# Patient Record
Sex: Female | Born: 1935 | Race: White | Hispanic: No
Health system: Southern US, Community
[De-identification: ages and names within clinical notes are randomized; demographics above are authoritative.]

---

## 1997-08-18 ENCOUNTER — Inpatient Hospital Stay (HOSPITAL_COMMUNITY): Admission: RE | Admit: 1997-08-18 | Discharge: 1997-08-24 | Payer: Self-pay | Admitting: Orthopaedic Surgery

## 1997-08-26 ENCOUNTER — Other Ambulatory Visit: Admission: RE | Admit: 1997-08-26 | Discharge: 1997-08-26 | Payer: Self-pay | Admitting: Orthopaedic Surgery

## 1997-08-30 ENCOUNTER — Other Ambulatory Visit: Admission: RE | Admit: 1997-08-30 | Discharge: 1997-08-30 | Payer: Self-pay | Admitting: Orthopaedic Surgery

## 1997-09-06 ENCOUNTER — Other Ambulatory Visit: Admission: RE | Admit: 1997-09-06 | Discharge: 1997-09-06 | Payer: Self-pay | Admitting: Orthopaedic Surgery

## 1997-09-13 ENCOUNTER — Other Ambulatory Visit: Admission: RE | Admit: 1997-09-13 | Discharge: 1997-09-13 | Payer: Self-pay | Admitting: Orthopaedic Surgery

## 1997-09-20 ENCOUNTER — Other Ambulatory Visit: Admission: RE | Admit: 1997-09-20 | Discharge: 1997-09-20 | Payer: Self-pay | Admitting: Orthopaedic Surgery

## 1998-06-06 ENCOUNTER — Ambulatory Visit (HOSPITAL_COMMUNITY): Admission: RE | Admit: 1998-06-06 | Discharge: 1998-06-06 | Payer: Self-pay | Admitting: Gastroenterology

## 1998-12-13 ENCOUNTER — Ambulatory Visit (HOSPITAL_COMMUNITY): Admission: RE | Admit: 1998-12-13 | Discharge: 1998-12-13 | Payer: Self-pay | Admitting: Internal Medicine

## 1998-12-13 ENCOUNTER — Encounter: Payer: Self-pay | Admitting: Internal Medicine

## 1998-12-14 ENCOUNTER — Encounter: Payer: Self-pay | Admitting: Internal Medicine

## 1998-12-14 ENCOUNTER — Ambulatory Visit (HOSPITAL_COMMUNITY): Admission: RE | Admit: 1998-12-14 | Discharge: 1998-12-14 | Payer: Self-pay | Admitting: Internal Medicine

## 2000-04-18 ENCOUNTER — Other Ambulatory Visit: Admission: RE | Admit: 2000-04-18 | Discharge: 2000-04-18 | Payer: Self-pay | Admitting: Obstetrics and Gynecology

## 2000-07-08 ENCOUNTER — Encounter (INDEPENDENT_AMBULATORY_CARE_PROVIDER_SITE_OTHER): Payer: Self-pay

## 2000-07-08 ENCOUNTER — Inpatient Hospital Stay (HOSPITAL_COMMUNITY): Admission: RE | Admit: 2000-07-08 | Discharge: 2000-07-10 | Payer: Self-pay | Admitting: Obstetrics and Gynecology

## 2000-11-05 ENCOUNTER — Emergency Department (HOSPITAL_COMMUNITY): Admission: EM | Admit: 2000-11-05 | Discharge: 2000-11-05 | Payer: Self-pay | Admitting: Emergency Medicine

## 2001-09-11 ENCOUNTER — Encounter (INDEPENDENT_AMBULATORY_CARE_PROVIDER_SITE_OTHER): Payer: Self-pay

## 2001-09-11 ENCOUNTER — Ambulatory Visit (HOSPITAL_COMMUNITY): Admission: RE | Admit: 2001-09-11 | Discharge: 2001-09-11 | Payer: Self-pay | Admitting: Gastroenterology

## 2001-11-16 ENCOUNTER — Encounter: Admission: RE | Admit: 2001-11-16 | Discharge: 2001-11-16 | Payer: Self-pay | Admitting: Internal Medicine

## 2001-11-16 ENCOUNTER — Encounter: Payer: Self-pay | Admitting: Internal Medicine

## 2002-08-27 ENCOUNTER — Encounter: Payer: Self-pay | Admitting: Internal Medicine

## 2002-08-27 ENCOUNTER — Encounter: Admission: RE | Admit: 2002-08-27 | Discharge: 2002-08-27 | Payer: Self-pay | Admitting: Internal Medicine

## 2004-01-23 ENCOUNTER — Emergency Department (HOSPITAL_COMMUNITY): Admission: EM | Admit: 2004-01-23 | Discharge: 2004-01-24 | Payer: Self-pay | Admitting: Emergency Medicine

## 2004-02-14 ENCOUNTER — Encounter: Admission: RE | Admit: 2004-02-14 | Discharge: 2004-02-14 | Payer: Self-pay | Admitting: Internal Medicine

## 2004-02-22 ENCOUNTER — Ambulatory Visit: Payer: Self-pay | Admitting: Internal Medicine

## 2004-03-28 ENCOUNTER — Ambulatory Visit: Payer: Self-pay | Admitting: Internal Medicine

## 2004-04-07 ENCOUNTER — Ambulatory Visit: Payer: Self-pay | Admitting: Family Medicine

## 2004-04-11 ENCOUNTER — Ambulatory Visit: Payer: Self-pay | Admitting: Internal Medicine

## 2004-04-11 ENCOUNTER — Inpatient Hospital Stay (HOSPITAL_COMMUNITY): Admission: EM | Admit: 2004-04-11 | Discharge: 2004-04-14 | Payer: Self-pay | Admitting: Emergency Medicine

## 2004-05-02 ENCOUNTER — Ambulatory Visit: Payer: Self-pay | Admitting: Internal Medicine

## 2004-07-06 ENCOUNTER — Ambulatory Visit: Payer: Self-pay | Admitting: Internal Medicine

## 2004-07-19 ENCOUNTER — Ambulatory Visit: Payer: Self-pay | Admitting: Internal Medicine

## 2004-09-20 ENCOUNTER — Emergency Department (HOSPITAL_COMMUNITY): Admission: EM | Admit: 2004-09-20 | Discharge: 2004-09-20 | Payer: Self-pay | Admitting: Emergency Medicine

## 2004-11-01 ENCOUNTER — Ambulatory Visit: Payer: Self-pay | Admitting: Internal Medicine

## 2004-11-02 ENCOUNTER — Ambulatory Visit: Payer: Self-pay | Admitting: Internal Medicine

## 2005-02-20 ENCOUNTER — Ambulatory Visit: Payer: Self-pay | Admitting: Internal Medicine

## 2005-03-21 ENCOUNTER — Ambulatory Visit: Payer: Self-pay | Admitting: Internal Medicine

## 2005-03-28 ENCOUNTER — Ambulatory Visit: Payer: Self-pay | Admitting: Internal Medicine

## 2005-04-24 ENCOUNTER — Ambulatory Visit: Payer: Self-pay | Admitting: Internal Medicine

## 2005-05-27 ENCOUNTER — Ambulatory Visit: Payer: Self-pay | Admitting: Pulmonary Disease

## 2005-06-04 ENCOUNTER — Ambulatory Visit: Payer: Self-pay | Admitting: Internal Medicine

## 2005-06-11 ENCOUNTER — Ambulatory Visit: Payer: Self-pay | Admitting: Internal Medicine

## 2005-08-07 ENCOUNTER — Ambulatory Visit: Payer: Self-pay | Admitting: Internal Medicine

## 2005-10-22 ENCOUNTER — Ambulatory Visit: Payer: Self-pay | Admitting: Internal Medicine

## 2005-12-26 ENCOUNTER — Ambulatory Visit: Payer: Self-pay | Admitting: Internal Medicine

## 2006-01-10 ENCOUNTER — Ambulatory Visit: Payer: Self-pay | Admitting: Internal Medicine

## 2006-01-20 ENCOUNTER — Ambulatory Visit: Payer: Self-pay | Admitting: Internal Medicine

## 2006-02-06 ENCOUNTER — Ambulatory Visit: Payer: Self-pay | Admitting: Internal Medicine

## 2006-03-19 ENCOUNTER — Ambulatory Visit: Payer: Self-pay | Admitting: Internal Medicine

## 2006-04-01 ENCOUNTER — Ambulatory Visit: Payer: Self-pay | Admitting: Internal Medicine

## 2006-04-17 ENCOUNTER — Ambulatory Visit: Payer: Self-pay | Admitting: Internal Medicine

## 2006-05-14 ENCOUNTER — Ambulatory Visit: Payer: Self-pay | Admitting: Internal Medicine

## 2006-07-23 ENCOUNTER — Emergency Department (HOSPITAL_COMMUNITY): Admission: EM | Admit: 2006-07-23 | Discharge: 2006-07-24 | Payer: Self-pay | Admitting: Emergency Medicine

## 2006-08-19 ENCOUNTER — Ambulatory Visit: Payer: Self-pay | Admitting: Internal Medicine

## 2006-10-09 ENCOUNTER — Ambulatory Visit: Payer: Self-pay | Admitting: Internal Medicine

## 2006-11-06 DIAGNOSIS — M159 Polyosteoarthritis, unspecified: Secondary | ICD-10-CM | POA: Insufficient documentation

## 2006-11-06 DIAGNOSIS — M810 Age-related osteoporosis without current pathological fracture: Secondary | ICD-10-CM | POA: Insufficient documentation

## 2006-11-06 DIAGNOSIS — Z8669 Personal history of other diseases of the nervous system and sense organs: Secondary | ICD-10-CM

## 2006-11-06 DIAGNOSIS — F329 Major depressive disorder, single episode, unspecified: Secondary | ICD-10-CM

## 2006-11-06 DIAGNOSIS — F0390 Unspecified dementia without behavioral disturbance: Secondary | ICD-10-CM

## 2006-11-06 DIAGNOSIS — H918X9 Other specified hearing loss, unspecified ear: Secondary | ICD-10-CM | POA: Insufficient documentation

## 2006-11-06 DIAGNOSIS — J45909 Unspecified asthma, uncomplicated: Secondary | ICD-10-CM | POA: Insufficient documentation

## 2006-11-06 DIAGNOSIS — Z87898 Personal history of other specified conditions: Secondary | ICD-10-CM | POA: Insufficient documentation

## 2006-11-13 ENCOUNTER — Ambulatory Visit: Payer: Self-pay | Admitting: Internal Medicine

## 2006-11-13 LAB — CONVERTED CEMR LAB
Bilirubin Urine: NEGATIVE
Crystals: NEGATIVE
Hemoglobin, Urine: NEGATIVE
Ketones, ur: NEGATIVE mg/dL
Mucus, UA: NEGATIVE
Nitrite: NEGATIVE
Specific Gravity, Urine: 1.02 (ref 1.000–1.03)
Urine Glucose: NEGATIVE mg/dL
Urobilinogen, UA: 0.2 (ref 0.0–1.0)
pH: 5.5 (ref 5.0–8.0)

## 2006-11-18 ENCOUNTER — Ambulatory Visit: Payer: Self-pay | Admitting: Internal Medicine

## 2006-11-18 LAB — CONVERTED CEMR LAB
ALT: 32 U/L
AST: 19 U/L
Albumin: 3.2 g/dL — ABNORMAL LOW
Alkaline Phosphatase: 94 U/L
BUN: 7 mg/dL
Basophils Absolute: 0 10*3/uL
Basophils Relative: 0.6 %
Bilirubin, Direct: 0.1 mg/dL
CO2: 30 meq/L
Calcium: 9.7 mg/dL
Chloride: 104 meq/L
Cholesterol: 171 mg/dL
Creatinine, Ser: 0.7 mg/dL
Eosinophils Absolute: 0.4 10*3/uL
Eosinophils Relative: 6.3 % — ABNORMAL HIGH
GFR calc Af Amer: 103 mL/min
GFR calc non Af Amer: 85 mL/min
Glucose, Bld: 106 mg/dL — ABNORMAL HIGH
HCT: 40.6 %
HDL: 25.1 mg/dL — ABNORMAL LOW
Hemoglobin: 13.6 g/dL
Hgb A1c MFr Bld: 6.4 % — ABNORMAL HIGH
LDL Cholesterol: 121 mg/dL — ABNORMAL HIGH
Lymphocytes Relative: 21.8 %
MCHC: 33.5 g/dL
MCV: 85.4 fL
Monocytes Absolute: 0.5 10*3/uL
Monocytes Relative: 9 %
Neutro Abs: 3.7 10*3/uL
Neutrophils Relative %: 62.3 %
Platelets: 262 10*3/uL
Potassium: 4.6 meq/L
RBC: 4.76 M/uL
RDW: 13 %
Sodium: 139 meq/L
TSH: 1.91 u[IU]/mL
Total Bilirubin: 0.6 mg/dL
Total CHOL/HDL Ratio: 6.8
Total Protein: 6.8 g/dL
Triglycerides: 122 mg/dL
VLDL: 24 mg/dL
WBC: 5.9 10*3/uL

## 2006-11-19 DIAGNOSIS — I10 Essential (primary) hypertension: Secondary | ICD-10-CM | POA: Insufficient documentation

## 2006-11-19 DIAGNOSIS — J309 Allergic rhinitis, unspecified: Secondary | ICD-10-CM | POA: Insufficient documentation

## 2006-12-05 ENCOUNTER — Ambulatory Visit: Payer: Self-pay | Admitting: Internal Medicine

## 2007-02-26 ENCOUNTER — Encounter: Payer: Self-pay | Admitting: Internal Medicine

## 2007-03-05 ENCOUNTER — Encounter: Payer: Self-pay | Admitting: Internal Medicine

## 2007-03-09 ENCOUNTER — Telehealth: Payer: Self-pay | Admitting: Internal Medicine

## 2007-03-09 DIAGNOSIS — E739 Lactose intolerance, unspecified: Secondary | ICD-10-CM

## 2007-03-09 DIAGNOSIS — E785 Hyperlipidemia, unspecified: Secondary | ICD-10-CM | POA: Insufficient documentation

## 2007-03-09 DIAGNOSIS — D649 Anemia, unspecified: Secondary | ICD-10-CM

## 2007-03-09 DIAGNOSIS — G43009 Migraine without aura, not intractable, without status migrainosus: Secondary | ICD-10-CM | POA: Insufficient documentation

## 2007-03-26 ENCOUNTER — Encounter: Payer: Self-pay | Admitting: Internal Medicine

## 2007-04-28 ENCOUNTER — Encounter: Payer: Self-pay | Admitting: Internal Medicine

## 2007-05-06 ENCOUNTER — Ambulatory Visit: Payer: Self-pay | Admitting: Internal Medicine

## 2007-05-06 ENCOUNTER — Inpatient Hospital Stay (HOSPITAL_COMMUNITY): Admission: EM | Admit: 2007-05-06 | Discharge: 2007-05-10 | Payer: Self-pay | Admitting: Emergency Medicine

## 2007-05-06 ENCOUNTER — Encounter: Payer: Self-pay | Admitting: Internal Medicine

## 2007-05-12 ENCOUNTER — Telehealth: Payer: Self-pay | Admitting: Internal Medicine

## 2007-05-13 ENCOUNTER — Encounter: Payer: Self-pay | Admitting: Internal Medicine

## 2007-05-15 ENCOUNTER — Encounter: Payer: Self-pay | Admitting: Internal Medicine

## 2007-05-19 ENCOUNTER — Encounter: Payer: Self-pay | Admitting: Internal Medicine

## 2007-06-04 ENCOUNTER — Ambulatory Visit: Payer: Self-pay | Admitting: Internal Medicine

## 2007-06-12 ENCOUNTER — Ambulatory Visit: Payer: Self-pay | Admitting: Internal Medicine

## 2007-07-27 ENCOUNTER — Encounter: Payer: Self-pay | Admitting: Internal Medicine

## 2007-08-31 ENCOUNTER — Encounter: Payer: Self-pay | Admitting: Internal Medicine

## 2007-09-01 ENCOUNTER — Encounter: Payer: Self-pay | Admitting: Internal Medicine

## 2007-09-03 ENCOUNTER — Encounter: Payer: Self-pay | Admitting: Internal Medicine

## 2007-10-01 ENCOUNTER — Ambulatory Visit: Payer: Self-pay | Admitting: Internal Medicine

## 2007-10-07 ENCOUNTER — Ambulatory Visit: Payer: Self-pay | Admitting: Internal Medicine

## 2007-10-19 ENCOUNTER — Encounter: Payer: Self-pay | Admitting: Internal Medicine

## 2007-10-26 ENCOUNTER — Telehealth: Payer: Self-pay | Admitting: Endocrinology

## 2007-10-26 ENCOUNTER — Ambulatory Visit: Payer: Self-pay | Admitting: Internal Medicine

## 2007-11-01 ENCOUNTER — Encounter: Payer: Self-pay | Admitting: Internal Medicine

## 2007-11-06 ENCOUNTER — Encounter: Payer: Self-pay | Admitting: Internal Medicine

## 2007-11-16 ENCOUNTER — Encounter: Payer: Self-pay | Admitting: Internal Medicine

## 2007-12-08 ENCOUNTER — Encounter: Payer: Self-pay | Admitting: Internal Medicine

## 2007-12-14 ENCOUNTER — Encounter: Payer: Self-pay | Admitting: Internal Medicine

## 2007-12-15 ENCOUNTER — Encounter: Payer: Self-pay | Admitting: Internal Medicine

## 2007-12-16 ENCOUNTER — Ambulatory Visit: Payer: Self-pay | Admitting: Internal Medicine

## 2007-12-24 ENCOUNTER — Telehealth: Payer: Self-pay | Admitting: Internal Medicine

## 2007-12-29 ENCOUNTER — Encounter: Payer: Self-pay | Admitting: Internal Medicine

## 2007-12-31 ENCOUNTER — Encounter: Payer: Self-pay | Admitting: Internal Medicine

## 2008-01-12 ENCOUNTER — Telehealth: Payer: Self-pay | Admitting: Internal Medicine

## 2008-01-13 ENCOUNTER — Encounter: Payer: Self-pay | Admitting: Internal Medicine

## 2008-01-18 ENCOUNTER — Telehealth: Payer: Self-pay | Admitting: Internal Medicine

## 2008-01-18 ENCOUNTER — Encounter: Payer: Self-pay | Admitting: Internal Medicine

## 2008-02-08 ENCOUNTER — Ambulatory Visit: Payer: Self-pay | Admitting: Internal Medicine

## 2008-02-09 ENCOUNTER — Encounter: Payer: Self-pay | Admitting: Internal Medicine

## 2008-02-25 ENCOUNTER — Ambulatory Visit: Payer: Self-pay | Admitting: Internal Medicine

## 2008-02-25 DIAGNOSIS — N390 Urinary tract infection, site not specified: Secondary | ICD-10-CM

## 2008-02-25 DIAGNOSIS — R32 Unspecified urinary incontinence: Secondary | ICD-10-CM

## 2008-02-25 DIAGNOSIS — E119 Type 2 diabetes mellitus without complications: Secondary | ICD-10-CM

## 2008-02-25 DIAGNOSIS — R5381 Other malaise: Secondary | ICD-10-CM

## 2008-02-25 DIAGNOSIS — R5383 Other fatigue: Secondary | ICD-10-CM

## 2008-02-25 LAB — CONVERTED CEMR LAB
ALT: 16 units/L (ref 0–35)
AST: 23 units/L (ref 0–37)
Albumin: 3.8 g/dL (ref 3.5–5.2)
Alkaline Phosphatase: 62 units/L (ref 39–117)
BUN: 12 mg/dL (ref 6–23)
Basophils Absolute: 0.1 10*3/uL (ref 0.0–0.1)
Basophils Relative: 1.9 % (ref 0.0–3.0)
Bilirubin Urine: NEGATIVE
Bilirubin, Direct: 0.1 mg/dL (ref 0.0–0.3)
CO2: 32 meq/L (ref 19–32)
Calcium: 10.1 mg/dL (ref 8.4–10.5)
Chloride: 101 meq/L (ref 96–112)
Cholesterol: 178 mg/dL (ref 0–200)
Creatinine, Ser: 0.7 mg/dL (ref 0.4–1.2)
Crystals: NEGATIVE
Eosinophils Absolute: 0.2 10*3/uL (ref 0.0–0.7)
Eosinophils Relative: 3.9 % (ref 0.0–5.0)
Folate: >20 ng/mL
GFR calc Af Amer: 103 mL/min
GFR calc non Af Amer: 85 mL/min
Glucose, Bld: 91 mg/dL (ref 70–99)
HCT: 43.9 % (ref 36.0–46.0)
HDL: 41 mg/dL (ref >39.0)
Hemoglobin, Urine: NEGATIVE
Hemoglobin: 15.4 g/dL — ABNORMAL HIGH (ref 12.0–15.0)
Hgb A1c MFr Bld: 6.5 % — ABNORMAL HIGH (ref 4.6–6.0)
Ketones, ur: NEGATIVE mg/dL
LDL Cholesterol: 117 mg/dL — ABNORMAL HIGH (ref 0–99)
Lymphocytes Relative: 22.2 % (ref 12.0–46.0)
MCHC: 35.1 g/dL (ref 30.0–36.0)
MCV: 83.7 fL (ref 78.0–100.0)
Monocytes Absolute: 0.6 10*3/uL (ref 0.1–1.0)
Monocytes Relative: 10.4 % (ref 3.0–12.0)
Mucus, UA: NEGATIVE
Neutro Abs: 3.5 10*3/uL (ref 1.4–7.7)
Neutrophils Relative %: 61.6 % (ref 43.0–77.0)
Nitrite: POSITIVE — AB
Platelets: 157 10*3/uL (ref 150–400)
Potassium: 4.7 meq/L (ref 3.5–5.1)
RBC: 5.24 M/uL — ABNORMAL HIGH (ref 3.87–5.11)
RDW: 13.3 % (ref 11.5–14.6)
Sodium: 139 meq/L (ref 135–145)
Specific Gravity, Urine: 1.01 (ref 1.000–1.03)
TSH: 2.58 microintl units/mL (ref 0.35–5.50)
Total Bilirubin: 0.6 mg/dL (ref 0.3–1.2)
Total CHOL/HDL Ratio: 4.3
Total Protein, Urine: NEGATIVE mg/dL
Total Protein: 7.7 g/dL (ref 6.0–8.3)
Triglycerides: 102 mg/dL (ref 0–149)
Urine Glucose: NEGATIVE mg/dL
Urobilinogen, UA: 0.2 (ref 0.0–1.0)
VLDL: 20 mg/dL (ref 0–40)
Vit D, 1,25-Dihydroxy: 40 (ref 30–89)
Vitamin B-12: 553 pg/mL (ref 211–911)
WBC: 5.7 10*3/uL (ref 4.5–10.5)
pH: 6.5 (ref 5.0–8.0)

## 2008-02-28 ENCOUNTER — Encounter: Payer: Self-pay | Admitting: Internal Medicine

## 2008-03-24 ENCOUNTER — Telehealth: Payer: Self-pay | Admitting: Internal Medicine

## 2008-03-29 ENCOUNTER — Encounter: Payer: Self-pay | Admitting: Internal Medicine

## 2008-04-12 ENCOUNTER — Encounter: Payer: Self-pay | Admitting: Internal Medicine

## 2008-04-23 ENCOUNTER — Emergency Department (HOSPITAL_BASED_OUTPATIENT_CLINIC_OR_DEPARTMENT_OTHER): Admission: EM | Admit: 2008-04-23 | Discharge: 2008-04-23 | Payer: Self-pay | Admitting: Emergency Medicine

## 2008-04-23 ENCOUNTER — Ambulatory Visit: Payer: Self-pay | Admitting: Interventional Radiology

## 2008-04-24 ENCOUNTER — Encounter: Payer: Self-pay | Admitting: Internal Medicine

## 2008-04-26 ENCOUNTER — Telehealth: Payer: Self-pay | Admitting: Internal Medicine

## 2008-05-19 ENCOUNTER — Encounter: Payer: Self-pay | Admitting: Internal Medicine

## 2008-06-02 ENCOUNTER — Encounter: Payer: Self-pay | Admitting: Internal Medicine

## 2008-06-27 ENCOUNTER — Encounter: Payer: Self-pay | Admitting: Internal Medicine

## 2008-08-08 ENCOUNTER — Encounter: Payer: Self-pay | Admitting: Internal Medicine

## 2008-08-29 ENCOUNTER — Telehealth: Payer: Self-pay | Admitting: Internal Medicine

## 2008-09-09 ENCOUNTER — Telehealth: Payer: Self-pay | Admitting: Internal Medicine

## 2008-09-20 ENCOUNTER — Ambulatory Visit: Payer: Self-pay | Admitting: Internal Medicine

## 2008-09-21 LAB — CONVERTED CEMR LAB
ALT: 12 units/L (ref 0–35)
AST: 23 units/L (ref 0–37)
Albumin: 3.9 g/dL (ref 3.5–5.2)
Alkaline Phosphatase: 56 units/L (ref 39–117)
BUN: 12 mg/dL (ref 6–23)
Basophils Absolute: 0 10*3/uL (ref 0.0–0.1)
Basophils Relative: 0.8 % (ref 0.0–3.0)
Bilirubin, Direct: 0.2 mg/dL (ref 0.0–0.3)
CO2: 29 meq/L (ref 19–32)
Calcium: 9.7 mg/dL (ref 8.4–10.5)
Chloride: 108 meq/L (ref 96–112)
Cholesterol: 210 mg/dL — ABNORMAL HIGH (ref 0–200)
Creatinine, Ser: 0.9 mg/dL (ref 0.4–1.2)
Direct LDL: 154.7 mg/dL
Eosinophils Absolute: 0.4 10*3/uL (ref 0.0–0.7)
Eosinophils Relative: 8 % — ABNORMAL HIGH (ref 0.0–5.0)
Folate: >20 ng/mL
GFR calc non Af Amer: 63.36 mL/min (ref >60)
Glucose, Bld: 92 mg/dL (ref 70–99)
HCT: 43.1 % (ref 36.0–46.0)
HDL: 39.5 mg/dL (ref >39.00)
Hemoglobin: 14.7 g/dL (ref 12.0–15.0)
Hgb A1c MFr Bld: 6.1 % (ref 4.6–6.5)
Lymphocytes Relative: 18.2 % (ref 12.0–46.0)
Lymphs Abs: 1 10*3/uL (ref 0.7–4.0)
MCHC: 34 g/dL (ref 30.0–36.0)
MCV: 86.4 fL (ref 78.0–100.0)
Monocytes Absolute: 0.5 10*3/uL (ref 0.1–1.0)
Monocytes Relative: 9 % (ref 3.0–12.0)
Neutro Abs: 3.6 10*3/uL (ref 1.4–7.7)
Neutrophils Relative %: 64 % (ref 43.0–77.0)
Platelets: 140 10*3/uL — ABNORMAL LOW (ref 150.0–400.0)
Potassium: 4.4 meq/L (ref 3.5–5.1)
RBC: 4.99 M/uL (ref 3.87–5.11)
RDW: 13.5 % (ref 11.5–14.6)
Sed Rate: 26 mm/hr — ABNORMAL HIGH (ref 0–22)
Sodium: 139 meq/L (ref 135–145)
TSH: 2.71 microintl units/mL (ref 0.35–5.50)
Total Bilirubin: 0.9 mg/dL (ref 0.3–1.2)
Total CHOL/HDL Ratio: 5
Total Protein: 8.5 g/dL — ABNORMAL HIGH (ref 6.0–8.3)
Triglycerides: 144 mg/dL (ref 0.0–149.0)
VLDL: 28.8 mg/dL (ref 0.0–40.0)
Vitamin B-12: 714 pg/mL (ref 211–911)
WBC: 5.5 10*3/uL (ref 4.5–10.5)

## 2008-09-22 ENCOUNTER — Encounter: Payer: Self-pay | Admitting: Internal Medicine

## 2008-09-22 ENCOUNTER — Telehealth: Payer: Self-pay | Admitting: Internal Medicine

## 2008-10-20 ENCOUNTER — Encounter: Payer: Self-pay | Admitting: Internal Medicine

## 2008-10-25 ENCOUNTER — Telehealth: Payer: Self-pay | Admitting: Internal Medicine

## 2008-10-27 ENCOUNTER — Telehealth: Payer: Self-pay | Admitting: Internal Medicine

## 2008-10-31 ENCOUNTER — Telehealth: Payer: Self-pay | Admitting: Internal Medicine

## 2008-12-27 ENCOUNTER — Telehealth: Payer: Self-pay | Admitting: Internal Medicine

## 2009-01-12 ENCOUNTER — Encounter: Payer: Self-pay | Admitting: Internal Medicine

## 2009-01-23 ENCOUNTER — Encounter: Payer: Self-pay | Admitting: Internal Medicine

## 2009-01-27 ENCOUNTER — Encounter: Payer: Self-pay | Admitting: Internal Medicine

## 2009-01-27 ENCOUNTER — Telehealth: Payer: Self-pay | Admitting: Internal Medicine

## 2009-02-02 ENCOUNTER — Encounter: Payer: Self-pay | Admitting: Internal Medicine

## 2009-02-14 ENCOUNTER — Encounter: Payer: Self-pay | Admitting: Internal Medicine

## 2009-02-15 ENCOUNTER — Ambulatory Visit (HOSPITAL_COMMUNITY): Admission: RE | Admit: 2009-02-15 | Discharge: 2009-02-15 | Payer: Self-pay | Admitting: Internal Medicine

## 2009-02-15 ENCOUNTER — Encounter: Payer: Self-pay | Admitting: Internal Medicine

## 2009-02-20 ENCOUNTER — Encounter: Payer: Self-pay | Admitting: Internal Medicine

## 2009-02-21 ENCOUNTER — Encounter: Payer: Self-pay | Admitting: Internal Medicine

## 2009-02-22 ENCOUNTER — Encounter: Payer: Self-pay | Admitting: Internal Medicine

## 2009-02-24 ENCOUNTER — Encounter: Payer: Self-pay | Admitting: Internal Medicine

## 2009-02-27 ENCOUNTER — Telehealth: Payer: Self-pay | Admitting: Internal Medicine

## 2009-03-01 ENCOUNTER — Telehealth: Payer: Self-pay | Admitting: Internal Medicine

## 2009-03-02 ENCOUNTER — Encounter: Payer: Self-pay | Admitting: Internal Medicine

## 2009-03-03 ENCOUNTER — Encounter: Payer: Self-pay | Admitting: Internal Medicine

## 2009-03-08 ENCOUNTER — Telehealth (INDEPENDENT_AMBULATORY_CARE_PROVIDER_SITE_OTHER): Payer: Self-pay | Admitting: *Deleted

## 2009-03-10 ENCOUNTER — Encounter: Payer: Self-pay | Admitting: Internal Medicine

## 2009-03-13 ENCOUNTER — Encounter: Payer: Self-pay | Admitting: Internal Medicine

## 2009-03-14 ENCOUNTER — Encounter: Payer: Self-pay | Admitting: Internal Medicine

## 2009-03-15 ENCOUNTER — Encounter: Payer: Self-pay | Admitting: Internal Medicine

## 2009-03-20 ENCOUNTER — Encounter: Payer: Self-pay | Admitting: Internal Medicine

## 2009-03-21 ENCOUNTER — Encounter: Payer: Self-pay | Admitting: Internal Medicine

## 2009-03-24 ENCOUNTER — Encounter: Payer: Self-pay | Admitting: Internal Medicine

## 2009-03-28 ENCOUNTER — Telehealth: Payer: Self-pay | Admitting: Internal Medicine

## 2009-03-30 ENCOUNTER — Encounter: Payer: Self-pay | Admitting: Internal Medicine

## 2009-04-12 ENCOUNTER — Encounter: Payer: Self-pay | Admitting: Internal Medicine

## 2009-04-18 ENCOUNTER — Encounter: Payer: Self-pay | Admitting: Internal Medicine

## 2009-04-24 ENCOUNTER — Telehealth: Payer: Self-pay | Admitting: Internal Medicine

## 2009-04-25 ENCOUNTER — Ambulatory Visit: Payer: Self-pay | Admitting: Internal Medicine

## 2009-04-25 DIAGNOSIS — M549 Dorsalgia, unspecified: Secondary | ICD-10-CM | POA: Insufficient documentation

## 2009-04-26 LAB — CONVERTED CEMR LAB
ALT: 129 units/L — ABNORMAL HIGH (ref 0–35)
AST: 189 units/L — ABNORMAL HIGH (ref 0–37)
Albumin: 2.5 g/dL — ABNORMAL LOW (ref 3.5–5.2)
Alkaline Phosphatase: 98 units/L (ref 39–117)
BUN: 7 mg/dL (ref 6–23)
Basophils Absolute: 0 10*3/uL (ref 0.0–0.1)
Basophils Relative: 0 % (ref 0.0–3.0)
Bilirubin, Direct: 0.3 mg/dL (ref 0.0–0.3)
CO2: 28 meq/L (ref 19–32)
Calcium: 9.4 mg/dL (ref 8.4–10.5)
Chloride: 98 meq/L (ref 96–112)
Creatinine, Ser: 0.9 mg/dL (ref 0.4–1.2)
Eosinophils Absolute: 0.2 10*3/uL (ref 0.0–0.7)
Eosinophils Relative: 3.5 % (ref 0.0–5.0)
Folate: 17.9 ng/mL
GFR calc non Af Amer: 63.26 mL/min (ref >60)
Glucose, Bld: 93 mg/dL (ref 70–99)
HCT: 47.3 % — ABNORMAL HIGH (ref 36.0–46.0)
Hemoglobin: 15.6 g/dL — ABNORMAL HIGH (ref 12.0–15.0)
Hgb A1c MFr Bld: 6.1 % (ref 4.6–6.5)
Iron: 73 ug/dL (ref 42–145)
Lymphocytes Relative: 14.6 % (ref 12.0–46.0)
Lymphs Abs: 0.9 10*3/uL (ref 0.7–4.0)
MCHC: 33 g/dL (ref 30.0–36.0)
MCV: 89 fL (ref 78.0–100.0)
Monocytes Absolute: 0.5 10*3/uL (ref 0.1–1.0)
Monocytes Relative: 7.3 % (ref 3.0–12.0)
Neutro Abs: 4.7 10*3/uL (ref 1.4–7.7)
Neutrophils Relative %: 74.6 % (ref 43.0–77.0)
Platelets: 147 10*3/uL — ABNORMAL LOW (ref 150.0–400.0)
Potassium: 4.2 meq/L (ref 3.5–5.1)
RBC: 5.32 M/uL — ABNORMAL HIGH (ref 3.87–5.11)
RDW: 16.7 % — ABNORMAL HIGH (ref 11.5–14.6)
Saturation Ratios: 32.6 % (ref 20.0–50.0)
Sed Rate: 25 mm/hr — ABNORMAL HIGH (ref 0–22)
Sodium: 132 meq/L — ABNORMAL LOW (ref 135–145)
TSH: 2.24 microintl units/mL (ref 0.35–5.50)
Total Bilirubin: 1 mg/dL (ref 0.3–1.2)
Total Protein: 9.6 g/dL — ABNORMAL HIGH (ref 6.0–8.3)
Transferrin: 159.8 mg/dL — ABNORMAL LOW (ref 212.0–360.0)
Vitamin B-12: >1500 pg/mL — ABNORMAL HIGH (ref 211–911)
WBC: 6.3 10*3/uL (ref 4.5–10.5)

## 2009-05-03 ENCOUNTER — Telehealth: Payer: Self-pay | Admitting: Internal Medicine

## 2009-05-23 ENCOUNTER — Encounter: Payer: Self-pay | Admitting: Internal Medicine

## 2009-05-23 ENCOUNTER — Emergency Department (HOSPITAL_COMMUNITY): Admission: EM | Admit: 2009-05-23 | Discharge: 2009-05-23 | Payer: Self-pay | Admitting: Emergency Medicine

## 2009-05-24 ENCOUNTER — Encounter: Payer: Self-pay | Admitting: Internal Medicine

## 2009-05-26 ENCOUNTER — Telehealth: Payer: Self-pay | Admitting: Internal Medicine

## 2009-05-29 ENCOUNTER — Ambulatory Visit: Payer: Self-pay | Admitting: Internal Medicine

## 2010-05-22 NOTE — Miscellaneous (Signed)
Summary: [facility]   Imported By: Sherian Rein 04/26/2009 07:16:09  _____________________________________________________________________  External Attachment:    Type:   Image     Comment:   External Document

## 2010-05-22 NOTE — Progress Notes (Signed)
  Phone Note Refill Request  on April 24, 2009 10:56 AM  Refills Requested: Medication #1:  ALENDRONATE SODIUM 70 MG  TABS 1 by mouth qwk   Dosage confirmed as above?Dosage Confirmed   Notes: [facility] Initial call taken by: Scharlene Gloss,  April 24, 2009 10:57 AM    Prescriptions: ALENDRONATE SODIUM 70 MG  TABS (ALENDRONATE SODIUM) 1 by mouth qwk  #4 x 6   Entered by:   Scharlene Gloss   Authorized by:   Corwin Levins MD   Signed by:   Scharlene Gloss on 04/24/2009   Method used:   Faxed to ...       [facility]* (retail)       [address] [place], [place]  [address]       Ph: [phone]       Fax: [phone]   RxID:   [id number] ALBUTEROL 90 MCG/ACT AERS (ALBUTEROL) Inhale 2 puff using inhaler every twelve hours  #1 x 7   Entered by:   Scharlene Gloss   Authorized by:   Corwin Levins MD   Signed by:   Scharlene Gloss on 04/24/2009   Method used:   Faxed to ...       [facility]* (retail)       [address] [place], [place]  [address]       Ph: [phone]       Fax: [phone]   RxID:   [id number]

## 2010-05-22 NOTE — Progress Notes (Signed)
  Phone Note Refill Request  on May 03, 2009 2:02 PM  Refills Requested: Medication #1:  SIMVASTATIN 40 MG TABS 1 by mouth once daily   Dosage confirmed as above?Dosage Confirmed   Notes: [facility] Initial call taken by: Scharlene Gloss,  May 03, 2009 2:03 PM    Prescriptions: SIMVASTATIN 40 MG TABS (SIMVASTATIN) 1 by mouth once daily  #90 x 3   Entered by:   Scharlene Gloss   Authorized by:   Corwin Levins MD   Signed by:   Scharlene Gloss on 05/03/2009   Method used:   Faxed to ...       [facility]* (retail)       [address] [place], [place]  [address]       Ph: [phone]       Fax: [phone]   RxID:   [id number]

## 2010-05-22 NOTE — Progress Notes (Signed)
  Phone Note Call from Patient Call back at [phone]   Caller: Patricia Herman - daughter in law -- [phone] Call For: Dr Jonny Ruiz Summary of Call: Pt daughter in law called to let Dr Jonny Ruiz know that Patricia Herman had taken a turn for the worse. Pt is not eating, taking medicine. Pt left [facility]1 to live with daughter in Social worker. Pt is deaf, cant read lips, but writes notes for her needs. Pt just lays in bed and moans and groans. Please advise Initial call taken by: Verdell Face,  May 26, 2009 9:52 AM  Follow-up for Phone Call        can we refer to hospice (or is she already being followed?);   consider going to ER if she has stopped eating or meds as she will likely be admitted if hospice has not already been arranged Follow-up by: Corwin Levins MD,  May 26, 2009 1:21 PM  Additional Follow-up for Phone Call Additional follow up Details #1::        pt's daughter in law will take pt to ER in [facility] Additional Follow-up by: Margaret Pyle, CMA,  May 26, 2009 1:53 PM

## 2010-05-22 NOTE — Letter (Signed)
Summary: [facility]   Imported By: Lester Gamaliel 05/29/2009 08:35:46  _____________________________________________________________________  External Attachment:    Type:   Image     Comment:   External Document

## 2010-05-22 NOTE — Miscellaneous (Signed)
Summary: [facility]   Imported By: Lester Sabine 05/25/2009 09:48:37  _____________________________________________________________________  External Attachment:    Type:   Image     Comment:   External Document

## 2010-05-22 NOTE — Assessment & Plan Note (Signed)
Summary: wt loss/cough/coming from [facility]/#/cd   Vital Signs:  Patient profile:   77 year old female Height:      63 inches Weight:      145 pounds O2 Sat:      82 % on Room air Temp:     97.2 degrees F oral Pulse rate:   92 / minute BP sitting:   142 / 92  (left arm) Cuff size:   regular  Vitals Entered ByZella Ball Ewing (April 25, 2009 11:03 AM)  O2 Flow:  Room air CC: SOB,congested,cough, weight loss/RE   CC:  SOB, congested, cough, and weight loss/RE.  History of Present Illness: pt with severe HOH, as well as dementia and poor historian;  also person with her from [facility] has left the exam room apparetnly to personally see to another pt's transportation needs so is unavailble to help wtih hx;  after written and otherwise rather loud questioning I am able to determine she has been weak, feeling poorly and ill with cough and some type of chest congestion in the past wk (or less it seems);  Pt denies CP, sob, doe, wheezing, orthopnea, pnd, worsening LE edema, palps, dizziness or syncope but again hx limited due to dementia.  Also c/o mid and rigth lower back pain, but I am unable to get from her the character, timing, aggrevating or alleviating factors or any assoc symptoms such as GU symtpoms.   Does not have tramadol on her med list and I dont get the sense she has significnat constipation at this time.  May have had some recent wt loss, but denies decreased by mouth intake, dysphagia, abd pain or change in bowel habits.  Pt denies polydipsia, polyuria, or low sugar symptoms such as shakiness improved with eating.  Overall good compliance with meds most likelky as she is assisted living.  No obvius bruising and pt denies recent falls  Problems Prior to Update: 1)  Back Pain  (ICD-724.5) 2)  Bronchitis-acute  (ICD-466.0) 3)  Fatigue  (ICD-780.79) 4)  Diabetes Mellitus, Type II  (ICD-250.00) 5)  Unspecified Urinary Incontinence  (ICD-788.30) 6)  Urinary Tract Infection,  Recurrent  (ICD-599.0) 7)  Common Migraine  (ICD-346.10) 8)  Family History Breast Cancer 1st Degree Relative <50  (ICD-V16.3) 9)  Anemia-nos  (ICD-285.9) 10)  Hyperlipidemia  (ICD-272.4) 11)  Glucose Intolerance  (ICD-271.3) 12)  Degenerative Joint Disease, Generalized  (ICD-715.00) 13)  Loss, Hearing Nec  (ICD-389.8) 14)  Dementia, Presenile W/depression  (ICD-290.13) 15)  Bell's Palsy, Hx of  (ICD-V12.49) 16)  Shingles, Hx of  (ICD-V13.8) 17)  Hypertension  (ICD-401.9) 18)  Allergic Rhinitis  (ICD-477.9) 19)  Osteoporosis  (ICD-733.00) 20)  Asthma  (ICD-493.90)  Medications Prior to Update: 1)  Advair Diskus 250-50 Mcg/dose Misc (Fluticasone-Salmeterol) .... Inhale 1 Puff As Directed Twice A Day 2)  Albuterol 90 Mcg/act Aers (Albuterol) .... Inhale 2 Puff Using Inhaler Every Twelve Hours 3)  Singulair 10 Mg Tabs (Montelukast Sodium) .... Take 1 Tablet By Mouth Once A Day 4)  Cetirizine Hcl 10 Mg Tabs (Cetirizine Hcl) .Marland Kitchen.. 1 By Mouth Once Daily 5)  Fluticasone Propionate 50 Mcg/act  Susp (Fluticasone Propionate) .... 2 Spray/side Once Daily 6)  Alendronate Sodium 70 Mg  Tabs (Alendronate Sodium) .Marland Kitchen.. 1 By Mouth Qwk 7)  Ecotrin Low Strength 81 Mg  Tbec (Aspirin) .Marland Kitchen.. 1 By Mouth Qd 8)  Gabapentin 100 Mg  Caps (Gabapentin) .... 2 By Mouth Tid 9)  Artificial Tears .... Use  Asd As Needed 10)  Tramadol Hcl 50 Mg Tabs (Tramadol Hcl) .Marland Kitchen.. 1 - 2 By Mouth Q 6hr As Needed 11)  Miralax  Powd (Polyethylene Glycol 3350) .Marland KitchenMarland KitchenMarland Kitchen 17 Gm By Mouth Once Daily 12)  Toviaz 8 Mg Xr24h-Tab (Fesoterodine Fumarate) .Marland Kitchen.. 1 By Mouth Once Daily 13)  Daily Vite  Tabs (Multiple Vitamin) .Marland Kitchen.. 1 By Mouth Once Daily 14)  Nitrofurantoin Macrocrystal 100 Mg Caps (Nitrofurantoin Macrocrystal) .Marland Kitchen.. 1 By Mouth Once Daily 15)  Caltrate 600+d 600-400 Mg-Unit Tabs (Calcium Carbonate-Vitamin D) .Marland Kitchen.. 1 By Mouth Two Times A Day 16)  Simvastatin 40 Mg Tabs (Simvastatin) .Marland Kitchen.. 1 By Mouth Once Daily 17)  Ricola Honey Drops .... Use  Asd For Cough 18)  Apap 325mg  .... 1 Tab By Mouth Every 6 Hours As Needed For Pain  Current Medications (verified): 1)  Advair Diskus 250-50 Mcg/dose Misc (Fluticasone-Salmeterol) .... Inhale 1 Puff As Directed Twice A Day 2)  Albuterol 90 Mcg/act Aers (Albuterol) .... Inhale 2 Puff Using Inhaler Every Twelve Hours 3)  Singulair 10 Mg Tabs (Montelukast Sodium) .... Take 1 Tablet By Mouth Once A Day 4)  Cetirizine Hcl 10 Mg Tabs (Cetirizine Hcl) .Marland Kitchen.. 1 By Mouth Once Daily 5)  Fluticasone Propionate 50 Mcg/act  Susp (Fluticasone Propionate) .... 2 Spray/side Once Daily 6)  Alendronate Sodium 70 Mg  Tabs (Alendronate Sodium) .Marland Kitchen.. 1 By Mouth Qwk 7)  Ecotrin Low Strength 81 Mg  Tbec (Aspirin) .Marland Kitchen.. 1 By Mouth Qd 8)  Gabapentin 100 Mg  Caps (Gabapentin) .... 2 By Mouth Tid 9)  Artificial Tears .... Use Asd As Needed 10)  Tramadol Hcl 50 Mg Tabs (Tramadol Hcl) .Marland Kitchen.. 1 - 2 By Mouth Q 6hr As Needed 11)  Miralax  Powd (Polyethylene Glycol 3350) .Marland KitchenMarland KitchenMarland Kitchen 17 Gm By Mouth Once Daily 12)  Toviaz 8 Mg Xr24h-Tab (Fesoterodine Fumarate) .Marland Kitchen.. 1 By Mouth Once Daily 13)  Daily Vite  Tabs (Multiple Vitamin) .Marland Kitchen.. 1 By Mouth Once Daily 14)  Nitrofurantoin Macrocrystal 100 Mg Caps (Nitrofurantoin Macrocrystal) .Marland Kitchen.. 1 By Mouth Once Daily 15)  Caltrate 600+d 600-400 Mg-Unit Tabs (Calcium Carbonate-Vitamin D) .Marland Kitchen.. 1 By Mouth Two Times A Day 16)  Simvastatin 40 Mg Tabs (Simvastatin) .Marland Kitchen.. 1 By Mouth Once Daily 17)  Ricola Honey Drops .... Use Asd For Cough 18)  Apap 325mg  .... 1 Tab By Mouth Every 6 Hours As Needed For Pain 19)  Amoxicillin-Pot Clavulanate 875-125 Mg Tabs (Amoxicillin-Pot Clavulanate) .Marland Kitchen.. 1 By Mouth Two Times A Day 20)  Tramadol Hcl 50 Mg Tabs (Tramadol Hcl) .Marland Kitchen.. 1po Q 6 Hrs As Needed Pain 21)  Hydrocodone-Homatropine 5-1.5 Mg/58ml Syrp (Hydrocodone-Homatropine) .Marland Kitchen.. 1 Tsp By Mouth Q 6 Hrs As Needed Cough  Allergies (verified): 1)  ! * Z-Pak  Past History:  Past Medical History: Last updated:  02/25/2008 Asthma Osteoporosis incontience rib fx 2000 Allergic rhinitis Hypertension H/O Shingles H/O Bells' Pasy Hearing loss Dementia, mild Degenrative Joint Disease glucose intolerance Hyperlipidemia Anemia-NOS migraine Diabetes mellitus, type II  Past Surgical History: Last updated: 11/06/2006 Knee Surgery- 1998 Appendectomy  Social History: Last updated: 02/25/2008 Former Smoker Alcohol use-no supportive son  Risk Factors: Smoking Status: quit (03/09/2007)  Review of Systems       all otherwise negative per pt - but limited helpfulness due to dementia  Physical Exam  General:  alert and underweight appearing. , mild ill, nontoxic Head:  normocephalic and atraumatic.   Eyes:  vision grossly intact, pupils equal, and pupils round.   Ears:  R ear normal and  L ear normal.   Nose:  no external deformity and no nasal discharge.   Mouth:  pharyngeal erythema and fair dentition.   Neck:  supple and no masses.   Lungs:  decreased BS right base, but no rales or wheezing Heart:  normal rate and regular rhythm.   Abdomen:  soft, non-tender, and normal bowel sounds.   Msk:  no joint tenderness and no joint swelling.  , spine kyphotic but nontender Extremities:  no edema, no erythema    Impression & Recommendations:  Problem # 1:  BRONCHITIS-ACUTE (ICD-466.0)  Her updated medication list for this problem includes:    Advair Diskus 250-50 Mcg/dose Misc (Fluticasone-salmeterol) ..... Inhale 1 puff as directed twice a day    Albuterol 90 Mcg/act Aers (Albuterol) ..... Inhale 2 puff using inhaler every twelve hours    Singulair 10 Mg Tabs (Montelukast sodium) .Marland Kitchen... Take 1 tablet by mouth once a day    Amoxicillin-pot Clavulanate 875-125 Mg Tabs (Amoxicillin-pot clavulanate) .Marland Kitchen... 1 by mouth two times a day    Hydrocodone-homatropine 5-1.5 Mg/71ml Syrp (Hydrocodone-homatropine) .Marland Kitchen... 1 tsp by mouth q 6 hrs as needed cough  Orders: T-2 View CXR, Same Day  (71020.5TC) at least bronchitis, and with exam I suspect PNA - treat as above, f/u any worsening signs or symptoms   Problem # 2:  BACK PAIN (ICD-724.5)  Her updated medication list for this problem includes:    Ecotrin Low Strength 81 Mg Tbec (Aspirin) .Marland Kitchen... 1 by mouth qd    Tramadol Hcl 50 Mg Tabs (Tramadol hcl) .Marland Kitchen... 1 - 2 by mouth q 6hr as needed    Tramadol Hcl 50 Mg Tabs (Tramadol hcl) .Marland Kitchen... 1po q 6 hrs as needed pain unclear etiology, will check urine studies and plain films  Orders: T-Thoracic Spine 2 Views [phone]) T-Lumbar Spine 2 Views (72100TC)  Problem # 3:  DIABETES MELLITUS, TYPE II (ICD-250.00)  Her updated medication list for this problem includes:    Ecotrin Low Strength 81 Mg Tbec (Aspirin) .Marland Kitchen... 1 by mouth once daily  to not likely need OHA, to check labs   Orders: TLB-A1C / Hgb A1C (Glycohemoglobin) (83036-A1C)  Problem # 4:  FATIGUE (ICD-780.79)  with wt loss recent, acutely prob related to above, but wil l need other lab eval as well as below  Orders: TLB-BMP (Basic Metabolic Panel-BMET) (80048-METABOL) TLB-CBC Platelet - w/Differential (85025-CBCD) TLB-Hepatic/Liver Function Pnl (80076-HEPATIC) TLB-TSH (Thyroid Stimulating Hormone) (84443-TSH) TLB-Sedimentation Rate (ESR) (85652-ESR) TLB-IBC Pnl (Iron/FE;Transferrin) (83550-IBC) TLB-B12 + Folate Pnl (45409_81191-Y78/GNF)  Complete Medication List: 1)  Advair Diskus 250-50 Mcg/dose Misc (Fluticasone-salmeterol) .... Inhale 1 puff as directed twice a day 2)  Albuterol 90 Mcg/act Aers (Albuterol) .... Inhale 2 puff using inhaler every twelve hours 3)  Singulair 10 Mg Tabs (Montelukast sodium) .... Take 1 tablet by mouth once a day 4)  Cetirizine Hcl 10 Mg Tabs (Cetirizine hcl) .Marland Kitchen.. 1 by mouth once daily 5)  Fluticasone Propionate 50 Mcg/act Susp (Fluticasone propionate) .... 2 spray/side once daily 6)  Alendronate Sodium 70 Mg Tabs (Alendronate sodium) .Marland Kitchen.. 1 by mouth qwk 7)  Ecotrin Low Strength 81  Mg Tbec (Aspirin) .Marland Kitchen.. 1 by mouth qd 8)  Gabapentin 100 Mg Caps (Gabapentin) .... 2 by mouth tid 9)  Artificial Tears  .... Use asd as needed 10)  Tramadol Hcl 50 Mg Tabs (Tramadol hcl) .Marland Kitchen.. 1 - 2 by mouth q 6hr as needed 11)  Miralax Powd (Polyethylene glycol 3350) .Marland KitchenMarland Kitchen. 17 gm by mouth once daily 12)  Toviaz 8 Mg Xr24h-tab (Fesoterodine fumarate) .Marland Kitchen.. 1 by mouth once daily 13)  Daily Vite Tabs (Multiple vitamin) .Marland Kitchen.. 1 by mouth once daily 14)  Nitrofurantoin Macrocrystal 100 Mg Caps (Nitrofurantoin macrocrystal) .Marland Kitchen.. 1 by mouth once daily 15)  Caltrate 600+d 600-400 Mg-unit Tabs (Calcium carbonate-vitamin d) .Marland Kitchen.. 1 by mouth two times a day 16)  Simvastatin 40 Mg Tabs (Simvastatin) .Marland Kitchen.. 1 by mouth once daily 17)  Ricola Honey Drops  .... Use asd for cough 18)  Apap 325mg   .... 1 tab by mouth every 6 hours as needed for pain 19)  Amoxicillin-pot Clavulanate 875-125 Mg Tabs (Amoxicillin-pot clavulanate) .Marland Kitchen.. 1 by mouth two times a day 20)  Tramadol Hcl 50 Mg Tabs (Tramadol hcl) .Marland Kitchen.. 1po q 6 hrs as needed pain 21)  Hydrocodone-homatropine 5-1.5 Mg/70ml Syrp (Hydrocodone-homatropine) .Marland Kitchen.. 1 tsp by mouth q 6 hrs as needed cough  Patient Instructions: 1)  Please take all new medications as prescribed - the antibiotic, and the cough medicine, as well as the pain medicine  2)  Continue all previous medications as before this visit  3)  Please go to the Lab in the basement for your blood and/or urine tests today 4)  Please go to Radiology in the basement level for your X-Rays today  5)  Please schedule a follow-up appointment in 1 month. Prescriptions: HYDROCODONE-HOMATROPINE 5-1.5 MG/5ML SYRP (HYDROCODONE-HOMATROPINE) 1 tsp by mouth q 6 hrs as needed cough  #6 oz x 1   Entered and Authorized by:   Corwin Levins MD   Signed by:   Corwin Levins MD on 04/25/2009   Method used:   Print then Give to Patient   RxID:   [id number] TRAMADOL HCL 50 MG TABS (TRAMADOL HCL) 1po q 6 hrs as needed pain  #120  x 5   Entered and Authorized by:   Corwin Levins MD   Signed by:   Corwin Levins MD on 04/25/2009   Method used:   Print then Give to Patient   RxID:   [id number] AMOXICILLIN-POT CLAVULANATE 875-125 MG TABS (AMOXICILLIN-POT CLAVULANATE) 1 by mouth two times a day  #20 x 0   Entered and Authorized by:   Corwin Levins MD   Signed by:   Corwin Levins MD on 04/25/2009   Method used:   Print then Give to Patient   RxID:   [id number]

## 2010-08-06 LAB — CBC
HCT: 44.7 % (ref 36.0–46.0)
Hemoglobin: 15 g/dL (ref 12.0–15.0)
MCHC: 33.5 g/dL (ref 30.0–36.0)
MCV: 84.7 fL (ref 78.0–100.0)
Platelets: 128 10*3/uL — ABNORMAL LOW (ref 150–400)
RBC: 5.28 MIL/uL — ABNORMAL HIGH (ref 3.87–5.11)
RDW: 13.3 % (ref 11.5–15.5)
WBC: 10.9 10*3/uL — ABNORMAL HIGH (ref 4.0–10.5)

## 2010-08-06 LAB — DIFFERENTIAL
Basophils Absolute: 0.1 10*3/uL (ref 0.0–0.1)
Basophils Relative: 1 % (ref 0–1)
Eosinophils Absolute: 0.6 10*3/uL (ref 0.0–0.7)
Eosinophils Relative: 6 % — ABNORMAL HIGH (ref 0–5)
Lymphocytes Relative: 4 % — ABNORMAL LOW (ref 12–46)
Lymphs Abs: 0.4 10*3/uL — ABNORMAL LOW (ref 0.7–4.0)
Monocytes Absolute: 0.7 10*3/uL (ref 0.1–1.0)
Monocytes Relative: 6 % (ref 3–12)
Neutro Abs: 9.1 10*3/uL — ABNORMAL HIGH (ref 1.7–7.7)
Neutrophils Relative %: 83 % — ABNORMAL HIGH (ref 43–77)

## 2010-08-06 LAB — URINALYSIS, ROUTINE W REFLEX MICROSCOPIC
Bilirubin Urine: NEGATIVE
Glucose, UA: NEGATIVE mg/dL
Hgb urine dipstick: NEGATIVE
Ketones, ur: 15 mg/dL — AB
Leukocytes, UA: NEGATIVE
Nitrite: NEGATIVE
Protein, ur: 30 mg/dL — AB
Specific Gravity, Urine: 1.01 (ref 1.005–1.030)
Urobilinogen, UA: 0.2 mg/dL (ref 0.0–1.0)
pH: 7 (ref 5.0–8.0)

## 2010-08-06 LAB — BASIC METABOLIC PANEL
BUN: 14 mg/dL (ref 6–23)
CO2: 27 mEq/L (ref 19–32)
Calcium: 9.4 mg/dL (ref 8.4–10.5)
Chloride: 99 mEq/L (ref 96–112)
Creatinine, Ser: 0.7 mg/dL (ref 0.4–1.2)
GFR calc Af Amer: >60 mL/min (ref >60)
GFR calc non Af Amer: >60 mL/min (ref >60)
Glucose, Bld: 146 mg/dL — ABNORMAL HIGH (ref 70–99)
Potassium: 4.4 mEq/L (ref 3.5–5.1)
Sodium: 135 mEq/L (ref 135–145)

## 2010-08-06 LAB — URINE CULTURE
Colony Count: NO GROWTH
Culture: NO GROWTH

## 2010-08-06 LAB — URINE MICROSCOPIC-ADD ON

## 2010-09-04 NOTE — H&P (Signed)
NAMEERCEL, NORMOYLE                 ACCOUNT NO.:  [id number]   MEDICAL RECORD NO.:  [id number]          PATIENT TYPE:  EMS   LOCATION:  [facility]                         FACILITY:  [facility]   PHYSICIAN:  Hollice Espy, M.D.DATE OF BIRTH:  03/17/1923   DATE OF ADMISSION:  05/06/2007  DATE OF DISCHARGE:                              HISTORY & PHYSICAL   PRIMARY CARE PHYSICIAN:  Corwin Levins, MD   CHIEF COMPLAINT:  Confusion, shortness of breath, nausea and vomiting.   HISTORY OF PRESENT ILLNESS:  The patient is an 77 year old white female  who resides in an assisted living who for the past several days has had  problems with nausea, vomiting and diarrhea.  Today she started to be  having some cough and shortness of breath.  She was brought into the  emergency room.  She looked to be somewhat confused, although, she has  no previous history of dementia.  In the emergency room, she had vitals  done.  She was noted to have a temperature of 101.8, highs 102.2.  Other  labs are noted for a white count of 17.4, 91% shift, BUN 26 and a  creatinine of 1, and a very large UTI.  Her chest x-ray also noted a  bilateral lower lobe pneumonia.  The patient, herself, is quite  somnolent.  She is not able to give me much of a history.  She appears  to be somewhat confused, but she does not respond to her name.  I am  unable to get review of systems from her.  She keeps falling asleep.   PAST MEDICAL HISTORY:  1. Hypertension.  2. History of pelvic prolapse.  3. Urinary incontinence.  4. Asthma.  5. Migraine headaches.   MEDICATIONS:  Based on her assisted living records, she is on:  1. Artificial Tears, drops in both eyes four times a day.  2. Neurontin 200 mg p.o. q.8h.  3. Advair 250/50 one puff b.i.d.  4. Os-Cal 600 b.i.d.  5. Singulair 10 mg daily.  6. Zyrtec 10 daily.  7. Multivitamin daily.  8. Aspirin 81 daily.  9. Fosamax 70 p.o. q.week.  10.MiraLax p.r.n.  11.Proventil p.r.n.  12.Flonase nasal spray p.r.n.  13.Vicodin p.r.n.   ALLERGIES:  Dust and pollen as well as reported rash with Zithromax.   SOCIAL HISTORY:  She currently resides at a friend's home.  No tobacco,  alcohol or drug use.   FAMILY HISTORY:  Noncontributory.   PHYSICAL EXAMINATION:  VITAL SIGNS:  Temperature 101.8, heart rate 93,  blood pressure 146/77, respirations 24, O2 saturation 92% on 2 liters.  GENERAL:  She is somnolent, oriented at best x2.  HEENT:  Normocephalic, atraumatic.  Mucous membranes dry.  No carotid  bruits.  HEART:  Regular rate and rhythm.  S1, S2.  A 2/6 systolic ejection  murmur.  LUNGS:  Decreased breath sounds throughout secondary to body habitus.  More so at the bases.  ABDOMEN:  Soft, obese, nontender, hyperactive bowel sounds.  EXTREMITIES:  Show cyanosis, clubbing, trace pitting edema.   LABORATORY DATA:  White count 17.4,  H&H 15.4 and 45, MCV 85, platelets  155, 91% shift.  UA notes large leukocyte esterase, nitrite positive, 15  ketones, moderate hemoglobin with 21-50 white cells and many bacteria.  Sodium 135, potassium 3.9, chloride 104, bicarbonate 23, BUN 26,  creatinine 1, glucose 219.   ASSESSMENT/PLAN:  1. Nausea, vomiting, diarrhea, likely viral gastroenteritis.  Will      treat subjectively IV fluids, pain and nausea medication plus PPI.  2. Pneumonia.  She may have had some mild aspiration secondary to her      vomiting.  Put her on oxygen nebulizers and go with broad coverage      antibiotics to cover both urine and pulmonary.  3. UTI.  Cover with IV antibiotics.      Hollice Espy, M.D.  Electronically Signed     SKK/MEDQ  D:  05/06/2007  T:  05/06/2007  Job:  [id number]   cc:   Corwin Levins, MD

## 2010-09-04 NOTE — Discharge Summary (Signed)
NAMEDENIJAH, KARRER                 ACCOUNT NO.:  [id number]   MEDICAL RECORD NO.:  [id number]          PATIENT TYPE:  INP   LOCATION:  6733                         FACILITY:  [facility]   PHYSICIAN:  Valetta Mole. Swords, MD    DATE OF BIRTH:  1922/07/13   DATE OF ADMISSION:  05/06/2007  DATE OF DISCHARGE:  05/10/2007                               DISCHARGE SUMMARY   DISCHARGE DIAGNOSES:  1. Pneumonia.  2. Possible viral gastroenteritis.  3. Presume urinary tract infection.  4. Hypertension.  5. Asthma.  6. Migraine headaches.  7. Osteoporosis.  8. Type 2 diabetes, new diagnosis.   DISCHARGE MEDICATIONS:  1. Fosamax 70 mg weekly.  2. Aspirin 81 mg p.o. daily.  3. Zyrtec 10 mg p.o. daily.  4. Singulair 10 mg p.o. daily.  5. Calcium 600.  6. Vitamin B 200.  7. Advair 250/50 1 puff b.i.d.  8. Neurontin 100 mg 2 caplets q.8h.  9. Artificial Tears p.r.n.  10.Flonase nasal spray 2 sprays each nostril p.r.n.  11.Proventil nebulizer 4 times daily p.r.n.  12.MiraLax powder 17 g in 8 ounces of water p.r.n.  13.Ultram 1-2 tablets (50 mg) q.6h. p.r.n.  14.Darvocet-N 100 1 p.o. b.i.d. p.r.n.  15.Albuterol inhaler 2 puffs q.12h. p.r.n.   PROCEDURE:  Chest x-ray on May 07, 2007 demonstrated bibasilar  airspace disease, left worse than right, consistent with pneumonia.  Also, cardiomegaly and vascular congestion without edema.   LABORATORY DATA:  Urine culture negative growth.  TSH normal at 2.039.  A1c elevated at 6.7%.  BMET normal except for a glucose of 111, calcium  8.0.  Blood cultures negative.  BNP on admission was mildly elevated at  140.   HOSPITAL COURSE:  The patient admitted to the hospitalist service on  May 06, 2007.  See Dr. Chancy Milroy note for details.   Problem 1.  GI.  The patient presented with nausea, vomiting, and  diarrhea.  Thought to be somewhat dehydrated.  Treated with IV fluids  with gentle hydration, given her pulmonary status.  Symptoms resolved.   Problem 2.  Pneumonia.  The patient presented with shortness of breath  and cough.  The patient thought to have pneumonia.  She was treated with  Rocephin and Zithromax.  She will be discharged on Ceftin for 7 more  days.   Problem 3.  Urine.  Patient thought to have urinary tract infection.  Urine culture did not grow anything.   Problem 4.  Hyperglycemia.  Patient with elevated blood sugars in the  hospital and also elevated A1c.  It is likely that she has diabetes.  The patient's blood sugars are to be followed.  She is instructed to go  on a diabetic diet.   DISPOSITION:  The patient will return to assisted living.  She is  ambulating in the halls without difficulty.  She is tolerating a diet  without nausea, vomiting, or diarrhea.      Bruce Rexene Edison Swords, MD  Electronically Signed     BHS/MEDQ  D:  05/10/2007  T:  05/10/2007  Job:  [id number]

## 2010-09-07 NOTE — Op Note (Signed)
[facility]  Patient:    Patricia Herman, Patricia Herman                    MRN: [id number] Proc. Date: 07/08/00 Adm. Date:  [id number] Attending:  Cordelia Pen Ii CC:         Guy Sandifer. Arleta Creek, M.D.   Operative Report  PROCEDURE:  Pubovaginal sling.  PREOPERATIVE DIAGNOSIS:  Stress incontinence.  POSTOPERATIVE DIAGNOSIS:  Stress incontinence.  SURGEON:  Excell Seltzer. Annabell Howells, M.D.  ANESTHESIA:  General.  DRAINS:  Suprapubic tube.  COMPLICATIONS:  None.  INDICATIONS:  Ms. Gondek is a 77 year old white female with stress incontinence and pelvic prolapse.  She is to undergo vaginal hysterectomy with anterior and posterior repair by Dr. Henderson Cloud.  I am to do a sling.  FINDINGS AT PROCEDURE:  The patient was in the operating room in lithotomy position and then prepped and draped by Dr. Henderson Cloud.  She was under a spinal anesthetic.  He had performed a vaginal hysterectomy with anterior and posterior repair and left a small midline incision open in the anterior vaginal wall.  I then entered the procedure, placed a Foley catheter.  The balloon was filled with 5 cc of fluid, and the bladder was drained.  A transverse incision approximately 4 cm in length was made just superior to the pubis in the midline.  The fat was spread to the fascia, and an antibiotic-soaked sponge was placed in the wound.  I then extended the anterior vaginal wall incision distally toward the medial ureteral level and elevated the vaginal mucosa off the pubourethral fascia on each side.  The Struhle scissors were then used to pierce the fascia at its attachment to the pubis, first on the right.  A finger was then passed in the retropubic space to ensure an adequate tract.  This was then repeated on the left.  A 2 x 8 cm Dermatrix strip was then prepared.  A #1 Prolene was placed through each end with a quadruple helical stitch.  Once the stitches were placed, the Raz needle was then brought  onto the field and passed from the abdominal incision to the vaginal incision, first on the right side; the suspension sutures were pulled into the right abdominal incision.  This was then repeated on the left. The strip was then secured over the proximal urethra and bladder neck with 2-0 Vicryl tacking sutures.  Once tacked into position, the suspension sutures were pulled up but not tied.  The anterior vaginal wall mucosa was then closed with a running, lock 2-0 Vicryl stitch.  Cystoscopy was then performed with the 22 [place] scope and the 70 degree lens.  Examination revealed good positioning of the sling material.  The ureteral orifices were effluxing clear urine.  There was no evidence of bladder wall injury.  The bladder was filled. The patient was placed in Trendelenburg position, and a microvasive Fader Tip suprapubic tube was placed through a separate stab wound superior to the abdominal incision under cystoscopic guidance.  Once the tube was in the bladder, the trocar was backed out.  The coil was formed and secured, and the catheter was placed to straight drainage.  At this point, the patient was taken out of the Trendelenburg.  Her suspension sutures were tied over hemostats to ensure minimal tension and then tied across the midline to each other.  The long ends were cut, and the knot was tucked back into the abdominal incision.  The abdominal  incision was then closed using a running 4-0 Vicryl intracuticular stitch.  The suprapubic tube was secured with a 0 silk suture.  A two inch Iodoform vaginal pack was placed, and the abdominal incision was reinforced with Steri-Strips, and a dressing was applied.  The patient was taken down from lithotomy position.  Her anesthetic was reversed. She was moved to the recovery room in stable condition, and there were no comp during the procedure. DD:  07/08/00 TD:  07/08/00 Job: [id number]

## 2010-09-07 NOTE — H&P (Signed)
Patricia Herman, Patricia Herman                 ACCOUNT NO.:  [id number]   MEDICAL RECORD NO.:  [id number]          PATIENT TYPE:  INP   LOCATION:  1824                         FACILITY:  [facility]   PHYSICIAN:  Thomos Lemons, D.O. [facility]   DATE OF BIRTH:  1922/05/12   DATE OF ADMISSION:  04/11/2004  DATE OF DISCHARGE:                                HISTORY & PHYSICAL   CHIEF COMPLAINT:  Fever and emesis.   HISTORY OF PRESENT ILLNESS:  The patient is a 77 year old white female who  presents to the emergency room with a 3-day history of febrile illness and  emesis.  The patient's son is with her during the history and physical, and  the son states that she developed a fever up to 103 with vomiting which  started approximately 3 days ago. The patient's son states that she was  initially able to keep down liquids but over the last 24 to 48 hours has had  increased lethargy and poor p.o. intake, and has been diffusely diaphoretic  due to her fever. The patient incidentally has gotten a flu shot within the  past 3 days.  Otherwise there is no history of unusual food intake. The  patient denies any diarrhea. There is no complaint of shortness of breath,  no chest pain.   REVIEW OF SYSTEMS:  Other review of systems - there is no weight gain,  weight loss. No dysuria or hematuria and all other systems negative.   PAST MEDICAL HISTORY:  1.  History of asthma on Advair.  2.  Remote history of pyelonephritis.  3.  Remote history of migraine headaches.   PAST SURGICAL HISTORY:  1.  Hysterectomy and bilateral salpingo-oophorectomy in 2002 secondary to      pelvic prolapse and stress incontinence.  2.  Total knee replacement in 1999 of the right knee.  3.  History of appendectomy.  4.  History of two amputations.   SOCIAL HISTORY:  The patient is widowed, currently lives with her son. She  is a retired Copywriter, advertising. Denies any alcohol use. Remote history of  tobacco use, quit approximately 20 years  ago.   FAMILY HISTORY:  Significant for cancer in her brothers including prostate  cancer, also brother has diabetes.  Breast cancer in her sister.   LABORATORY DATA:  CBC: WBC 4900, hemoglobin 14.0, hematocrit 40.5, platelets  of 183,000. BMP: Sodium of 130, potassium of 3.7, chloride of 103, CO2 of  20, BUN 9, creatinine 0.8, blood sugar of 141, calcium was 8.5, total  protein 6.1, albumin decreased at 2.9. AST was 76, ALT 39, alkaline  phosphatase 83 and total bilirubin 1.2.   A chest x-ray was performed in our ER which shows cardiomegaly without  congestive heart failure. Chronic interstitial changes and bibasilar  atelectasis and indeterminate age compression fractures of T9 and T12.   CURRENT MEDICATIONS:  1.  Advair 250/50 1 puff daily.  2.  Zyrtec 10 mg once daily.  3.  Singulair 10 mg once daily.   ALLERGIES:  The patient does not list any drug allergies.  PHYSICAL EXAMINATION:  GENERAL:  The patient is a 77 year old white female  who is hard of hearing, otherwise in no acute respiratory distress.  HEENT:  Pupils are equal, round and reactive to light bilaterally. Mucous  membranes were moist. There was no cervical adenopathy.  HEART: Regular rate and rhythm, no significant murmurs, rubs, or gallops.  Heart sounds are somewhat distant.  LUNGS:  Decreased breath sounds at the bases, otherwise no rhonchi, rales or  wheezing.  ABDOMEN:  Abdomen was protuberant, nontender, no Murphy's sign, no  splenomegaly.  LOWER EXTREMITY EXAM:  The patient had nonpalpable petechiae  of lower extremities bilaterally. No pitting edema.   IMPRESSION/RECOMMENDATIONS:  1.  Febrile illness with emesis in a 77 year old female. Likely viral      illness, no evidence on chest x-ray of pneumonia. However patient may be      dehydrated and will empirically cover her with Rocephin and      Azithromycin.  She had blood cultures and urine culture obtained in the      ER. Patient did not have any  abdominal tenderness but we will also check      a right upper quadrant ultrasound to rule out any possibility of      cholecystitis. We will send nasal washings for influenza and provide IV      hydration.   1.  History of asthma. Continue her Advair and p.r.n. nebulizers.  2.  Hyponatremia. We will check her TSH, serum osmolite and urine sodium to      better assess the etiology of her hyponatremia. The patient will be      hospitalized for observation.      Robe   RY/MEDQ  D:  04/11/2004  T:  04/11/2004  Job:  [id number]   cc:   Corwin Levins, M.D. [facility]

## 2010-09-07 NOTE — Procedures (Signed)
[facility]  Patient:    Patricia Herman, Patricia Herman Visit Number: [id number] MRN: [id number]          Service Type: END Location: ENDO Attending Physician:  Louie Bun Dictated by:   Everardo All Madilyn Fireman, M.D. Proc. Date: 09/11/01 Admit Date:  09/11/2001 Discharge Date: 09/11/2001                             Procedure Report  PROCEDURE:  Colonoscopy.  INDICATION FOR PROCEDURE:  Adenomatous colon polyp three years ago.  The patient also has a questionable history of ulcerative colitis, for which she has been off all medications for the last three years and has been clinically quiescent.  DESCRIPTION OF PROCEDURE:  The patient was placed in the left lateral decubitus position and placed on the pulse monitor with continuous low-flow oxygen delivered by nasal cannula.  She was sedated with 30 mg IV Demerol and 4 mg IV Versed.  The Olympus video colonoscope was inserted into the rectum and advanced to the cecum, confirmed by transillumination at McBurneys point and visualization of the ileocecal valve and appendiceal orifice.  The prep was excellent.  The cecum, ascending, transverse, descending, and sigmoid colon all appeared normal with no masses, polyps, diverticula, or other mucosal abnormalities.  Within the rectum at approximately 8 cm there was an irregular, slightly pedunculated polyp approximately 1.5-2 cm in length x 1 cm in width.  This was removed by snare.  The rectal mucosa otherwise appeared basically normal.  Biopsies were taken due to her reported history of ulcerative colitis.  The scope was then withdrawn and significant external hemorrhoids were seen.  The patient was then returned to the recovery room in stable condition.  She tolerated the procedure well, and there were no immediate complications.  IMPRESSION: 1. Moderate to large rectal polyp. 2. No visible evidence of ulcerative colitis.  PLAN:  Await all biopsy results. Dictated  by:   Everardo All Madilyn Fireman, M.D. Attending Physician:  Louie Bun DD:  09/11/01 TD:  09/15/01 Job: [id number]

## 2010-09-07 NOTE — Op Note (Signed)
[facility]  Patient:    Patricia Herman, Patricia Herman                    MRN: [id number] Proc. Date: 07/08/00 Adm. Date:  [id number] Attending:  Cordelia Pen Ii CC:         Excell Seltzer. Annabell Howells, M.D.  Durwin Reges., M.D.   Operative Report  PREOPERATIVE DIAGNOSIS:       Pelvic prolapse.  POSTOPERATIVE DIAGNOSIS:      Pelvic prolapse.  OPERATION:                    Total vaginal hysterectomy with anterior and posterior vaginal repair.  SURGEON:                      Guy Sandifer. Arleta Creek, M.D.  ASSISTANT:                    Juluis Mire, M.D.  ANESTHESIA:                   Spinal.  ESTIMATED BLOOD LOSS:         150 cc.  INDICATIONS AND CONSENT:      The patient is a 77 year old widowed white female, G5, P5, with symptoms of pelvic prolapse and stress urinary incontinence.  Details dictated in History & Physical.  Total vaginal hysterectomy, bilateral salpingo-oophorectomy, and possible anterior and posterior vaginal repair were discussed with the patient.  Possible risks and complications have been discussed including but not limited to infection, bowel or bladder or ureteral damage, bleeding requiring transfusion of blood products and possible transfusion reaction, HIV and hepatitis acquisition, DVT, PE, pneumonia, fistula formation, and possible laparotomy.  All questions are answered, and consent is signed on the chart.  Dr. Bjorn Pippin will be doing a pubovaginal sling as well.  DESCRIPTION OF PROCEDURE:     The patient is taken to the operating room where spinal anesthetic is placed.  She is then placed in the dorsolithotomy position where she is prepped abdominally and vaginally.  Bladder is straight catheterized, and she is draped in a sterile fashion.  Weighted speculum is placed, and the posterior cul-de-sac is entered sharply without difficulty. Cervix is circumscribed with scalpel, and the mucosa is advanced sharply and bluntly.  The  uterosacral ligaments are then taken down bilaterally and ligated with transfixing sutures of 0 Monocryl.  All sutures will be 0 Monocryl unless otherwise designated.  The cardinal ligaments followed by the uterine vessels are then taken bilaterally.  Two bites above this level was taken bilaterally.  The anterior cul-de-sac is entered sharply during this process without difficulty.  The fundus is then delivered posteriorly. Proximal ligaments are clamped and cut, and the specimen is delivered. Careful inspection reveals the ovaries are totally out of sight and cannot be drawn down into the field.  Therefore, the pedicles are doubly ligated first with a free tie and then with a suture.  Excellent hemostasis is noted here. The uterosacral ligaments were then plicated to the vaginal cuff bilaterally. The uterosacral ligaments are then plicated in the midline with two separate sutures.  Cuff is then closed posteriorly with figure-of-eight.  Next, an anterior repair is carried out.  The anterior vaginal mucosa is dissected from the underlying bladder in the midline to a point that is approximately 2 cm below the urethral meatus.  This mucosa is dissected bilaterally bluntly and sharply.  A pursestring suture is used to reduce the bladder base.  Excess vaginal mucosa is removed, and the posterior apex of the mucosa is closed with interrupted figure-of-eights.  The anterior half of the vaginal cuff is closed in a similar fashion.  The remainder of the vaginal mucosa anteriorly is left open for Dr. Annabell Howells to use on his sling procedure.  Then attention is turned to the posterior repair.  The vaginal outlet is grasped inferior to the labia minora bilaterally, and a diamond-shaped wedge of tissue is removed with a scalpel.  Posterior vaginal mucosa is then dissected from the underlying rectum in the midline sharply.  This is then carried widely sharply and bluntly.  The rectovaginal fascia is  then reapproximated in the midline with interrupted sutures.  Excess mucosa is trimmed.  Posterior mucosa is closed in running locking fashion with 2-0 Monocryl suture.  This is carried out to the level of the introitus.  The levator muscles and fascia are reapproximated in the midline with interrupted sutures.  The 2-0 Monocryl is then carried from the posterior vaginal mucosa down to the skin in an episiotomy type closure.  At this point, blood loss is 150 cc.  The patient is stable.  Dr. Annabell Howells then begins his sling procedure.  A vaginal pack will be placed.  All counts are correct at this point. DD:  07/08/00 TD:  07/08/00 Job: [id number]

## 2010-09-07 NOTE — Discharge Summary (Signed)
NAMEELMIRA, OLKOWSKI                 ACCOUNT NO.:  [id number]   MEDICAL RECORD NO.:  [id number]          PATIENT TYPE:  INP   LOCATION:  5036                         FACILITY:  [facility]   PHYSICIAN:  Wanda Plump, MD [facility]    DATE OF BIRTH:  19-Aug-1922   DATE OF ADMISSION:  04/11/2004  DATE OF DISCHARGE:  04/14/2004                                 DISCHARGE SUMMARY   ADMISSION DIAGNOSIS:  Fever and emesis.   DISCHARGE DIAGNOSES:  1.  Viral syndrome.  2.  Chronic sinusitis by computerized tomography.  3.  Asthma with good control of her symptoms.  4.  Hypokalemia which is basically resolved.  5.  Low blood count consistent with viral illness.  6.  Reaction of Zithromax characterized by a rash.  7.  Elevated liver function tests.   BRIEF HISTORY AND PHYSICAL:  Mrs. Konz is a 77 year old white female who  presented to the emergency room with a three-day history of a febrile  illness.  The patient also was having difficulty her fluids down for the  last 48 hours, and she was increasingly lethargic.  Incidentally, the  patient got a flu shot within three days prior to admission.  She denies any  chest pain or shortness of breath.  On exam, the patient was in no acute  distress.  Lungs:  She has decreased breath sounds at the bases but  otherwise normal.  Heart:  Regular rate and rhythm.  Abdomen was  protuberant, nontender, no Murphy's sign, no splenomegaly.  Lower  extremities show no petechiae and no edema.   LABORATORY AND X-RAYS:  Ultrasound of the abdomen was unremarkable.  Chest x-  ray showed no acute changes.  Cardiomegaly without CHF.  CT of the sinuses  showed chronic changes throughout.  Initial CBC showed a white count of 4.9.  Before discharge, it dropped to 2.4.  Hemoglobin initially was 14, and at  the time of discharge was 12.9.  Platelets were 183,000 and at the time of  discharge were 173,000.  Potassium was 3.7 and at some point was 2.8, but at  the time of discharge  was 3.6.  Creatinine has been stable around 0.8 to  0.6.  Glucose has been 141, 107, 171.  A1C was only 5.7.  Liver function  tests were elevated.  AST was 83, ALT 40, alkaline phosphatase normal.  At  the time of the discharge, all stool cultures, blood cultures and  Clostridium difficile were negative.   HOSPITAL COURSE:  Mrs. Lavallie was admitted to the hospital with a febrile  illness.  She was empirically started on Rocephin and Zithromax as well as  nebulization and supportive care.  During this stage, she steadily improved.  At the time of the discharge, she was feeling better.  Her appetite was  improving;  however, it was not completely recovered.   My impression is that she had a viral illness.  All the antibiotics have  been stopped.  Incidentally, she had a reaction to Zithromax, and the family  is now aware of this situation. The blood  work did show that the LFTs were  elevated, and the white count was actually on the low side, and this, I  believe, is due to the viral illness. The CT showed chronic changes.  I do  not believe these indicate any acute process on the sinuses as she is not  tender in that area, and she had chronic sinus congestion.  At this point, I  think she got maximum hospital benefit, and she will be discharged home  under the care of her family with the following instructions.   DISCHARGE MEDICATIONS:  1.  Continue taking Advair, Zyrtec, and Singulair as before.  2.  Start Flonase once a day for sinus congestion.  3.  Take Phenergan 25 mg 1 or 2 every six hours for nausea as needed.  4.  Take Tylenol for pain or fever as needed.   DISCHARGE DIET:  Parke Simmers diet such as soup or crackers until you feel better.   The patient is aware that she is allergic to Zithromax.   FOLLOW UP:  She is to go back to the emergency room if she has high fever or  feels sick again.  She is to see her doctor next week as we need to recheck  the LFTs as well as the white  count.       JEP/MEDQ  D:  04/14/2004  T:  04/16/2004  Job:  [id number]

## 2010-09-07 NOTE — Discharge Summary (Signed)
[facility]  Patient:    Patricia Herman, Patricia Herman                          MRN: [id number] Adm. Date:  07/08/00 Disc. Date: 07/10/00 Attending:  Guy Sandifer. Arleta Creek, M.D. CC:         Excell Seltzer. Annabell Howells, M.D.   Discharge Summary  ADMITTING DIAGNOSES: 1. Pelvic prolapse. 2. Stress urinary incontinence.  DISCHARGE DIAGNOSES: 1. Pelvic prolapse. 2. Stress urinary incontinence.  PROCEDURE:  On July 08, 2000: 1. Total vaginal hysterectomy with anterior and posterior vaginal repair. 2. Pubovaginal sling per Dr. Bjorn Pippin.  REASON FOR ADMISSION:  This patient is a 77 year old, widowed, white female, G5, P5, with increasing symptoms of pelvic pressure and stress urinary incontinence.  Details are dictated in the history and physical examination. The patient is being admitted for surgical management.  HOSPITAL COURSE:  The patient was taken to the operating room and undergoes the above procedure without complication.  Estimated blood loss is 150 cc.  On the evening of surgery, she has good pain relief with stable vital signs and good urine output.  On the first postoperative day, she is ambulating, tolerating liquids, and remains afebrile.  Hemoglobin is 12.3 and white count is 5.2.  On the day of discharge, she has some nausea which she has at home at times.  She is passing flatus, tolerating a regular diet, and ambulating well.  CONDITION ON DISCHARGE:  Good.  DIET:  Regular as tolerated.  ACTIVITY:  No lifting.  No operation of automobiles.  No vaginal entry.  She is to call the office for problems including, but not limited to, temperature over 100 degrees, persistent nausea or vomiting, heavy vaginal bleeding, or increasing pain.  Follow-up is in the office in 1-2 weeks.  DISCHARGE MEDICATIONS: 1. Vicodin #20, 1-2 p.o. q.6h. p.r.n. 2. Multivitamins daily. 3. She will resume her medications as before admission otherwise. DD:  08/10/00 TD:  08/09/00 Job:  [id number]

## 2010-09-07 NOTE — H&P (Signed)
[facility]  Patient:    Patricia Herman, Patricia Herman                          MRN: [id number] Adm. Date:  07/08/00 Attending:  Guy Sandifer. Arleta Creek, M.D.                         History and Physical  CHIEF COMPLAINT:  Pelvic prolapse with stress incontinence.  HISTORY OF PRESENT ILLNESS:  This patient is a 77 year old widowed white female, G5, P5, with increasing symptoms of urinary incontinence.  She also complains of constant pelvic pressure and feels as if something is going to fall out all of the time.  It is also difficult for her to have a bowel movement.  After consultation and careful consideration of the options, she is being admitted for total vaginal hysterectomy, bilateral salpingo-oophorectomy, anterior and posterior vaginal repair, and pubovaginal sling procedure with Dr. Bjorn Pippin.  PAST MEDICAL HISTORY: 1. Asthma cared for by Dr. Valrie Hart.  She did have respiratory failure    as a complication of her asthma in 1997. 2. Pyelonephritis with E. coli in the distant past. 3. Migraine headaches with last episode in 1999.  PAST SURGICAL HISTORY: 1. Total knee replacement with Dr. Cleophas Dunker in 1999. 2. Knee surgery. 3. Appendectomy. 4. Rib fracture August 2000. 5. Toe amputations. 6. Bilateral cataract extractions. 7. Colonoscopy for benign polyp.  MEDICATIONS:  Miacalcin nasal spray, Singulair, Azmacort, and Serevent inhalers.  She recently finished a prednisone taper.  ALLERGIES:  No known drug allergies.  OBSTETRICAL HISTORY:  Vaginal delivery x 5.  FAMILY HISTORY:  Positive for diabetes in her brother, breast cancer in sister.  SOCIAL HISTORY:  Denies tobacco, alcohol, and drug abuse.  REVIEW OF SYSTEMS:  Negative except as above.  PHYSICAL EXAMINATION:  VITAL SIGNS:  Height 5 feet 4 inches, weight 148 pounds, blood pressure 130/92.  NECK:  Without thyromegaly.  LUNGS:  Clear to auscultation.  HEART:  Regular rate and  rhythm.  BACK: Without CVA tenderness.  BREASTS: Without mass, retraction, discharge.  ABDOMEN:  Soft, nontender without masses.  PELVIC:  Vulva, vagina, cervix without lesion.  Second degree cystocele and a rectocele are noted.  Possible enterocele noted.  Uterus is small, mobile, with first and second degree prolapse.  Adnexa nontender without masses.  RECTAL:  Hemorrhoids noted.  No other rectal masses.  EXTREMITIES:  Status post toe amputations.  NEUROLOGIC:  Grossly within normal limits.  ASSESSMENT:  Pelvic prolapse.  PLAN:  Total vaginal hysterectomy, bilateral salpingo-oophorectomy, anterior and posterior repair. DD:  07/01/00 TD:  07/01/00 Job: [id number]

## 2011-01-09 LAB — URINALYSIS, ROUTINE W REFLEX MICROSCOPIC
Nitrite: POSITIVE — AB
Protein, ur: 100 — AB
Specific Gravity, Urine: 1.02
Urobilinogen, UA: 0.2

## 2011-01-09 LAB — I-STAT 8, (EC8 V) (CONVERTED LAB)
Bicarbonate: 23
Glucose, Bld: 219 — ABNORMAL HIGH
TCO2: 24
pH, Ven: 7.469 — ABNORMAL HIGH

## 2011-01-09 LAB — CBC
HCT: 44.8
MCHC: 34
MCV: 84.9
Platelets: 155
RDW: 12.6

## 2011-01-09 LAB — CULTURE, BLOOD (ROUTINE X 2)

## 2011-01-09 LAB — DIFFERENTIAL
Basophils Absolute: 0
Basophils Relative: 0
Eosinophils Absolute: 0
Eosinophils Relative: 0

## 2011-01-09 LAB — POCT I-STAT CREATININE: Operator id: 272551

## 2011-01-09 LAB — URINE CULTURE: Culture: NO GROWTH

## 2011-01-09 LAB — URINE MICROSCOPIC-ADD ON

## 2011-01-10 LAB — CBC
HCT: 36.7
Hemoglobin: 12.3
WBC: 8.7

## 2011-01-10 LAB — BASIC METABOLIC PANEL
GFR calc non Af Amer: >60
Potassium: 3.5
Sodium: 136

## 2011-01-10 LAB — TSH: TSH: 2.039

## 2011-01-10 LAB — B-NATRIURETIC PEPTIDE (CONVERTED LAB): Pro B Natriuretic peptide (BNP): 140 — ABNORMAL HIGH

## 2011-04-23 DEATH — deceased

## 2011-05-22 IMAGING — CR DG THORACIC SPINE 2V
3 series · 3 of 3 positions shown · non-contrast
Comparison: 09/20/2004 lateral chest radiograph

CLINICAL DATA: Back pain

THORACIC SPINE - 2 VIEW

[view not recorded (1 of 3)]
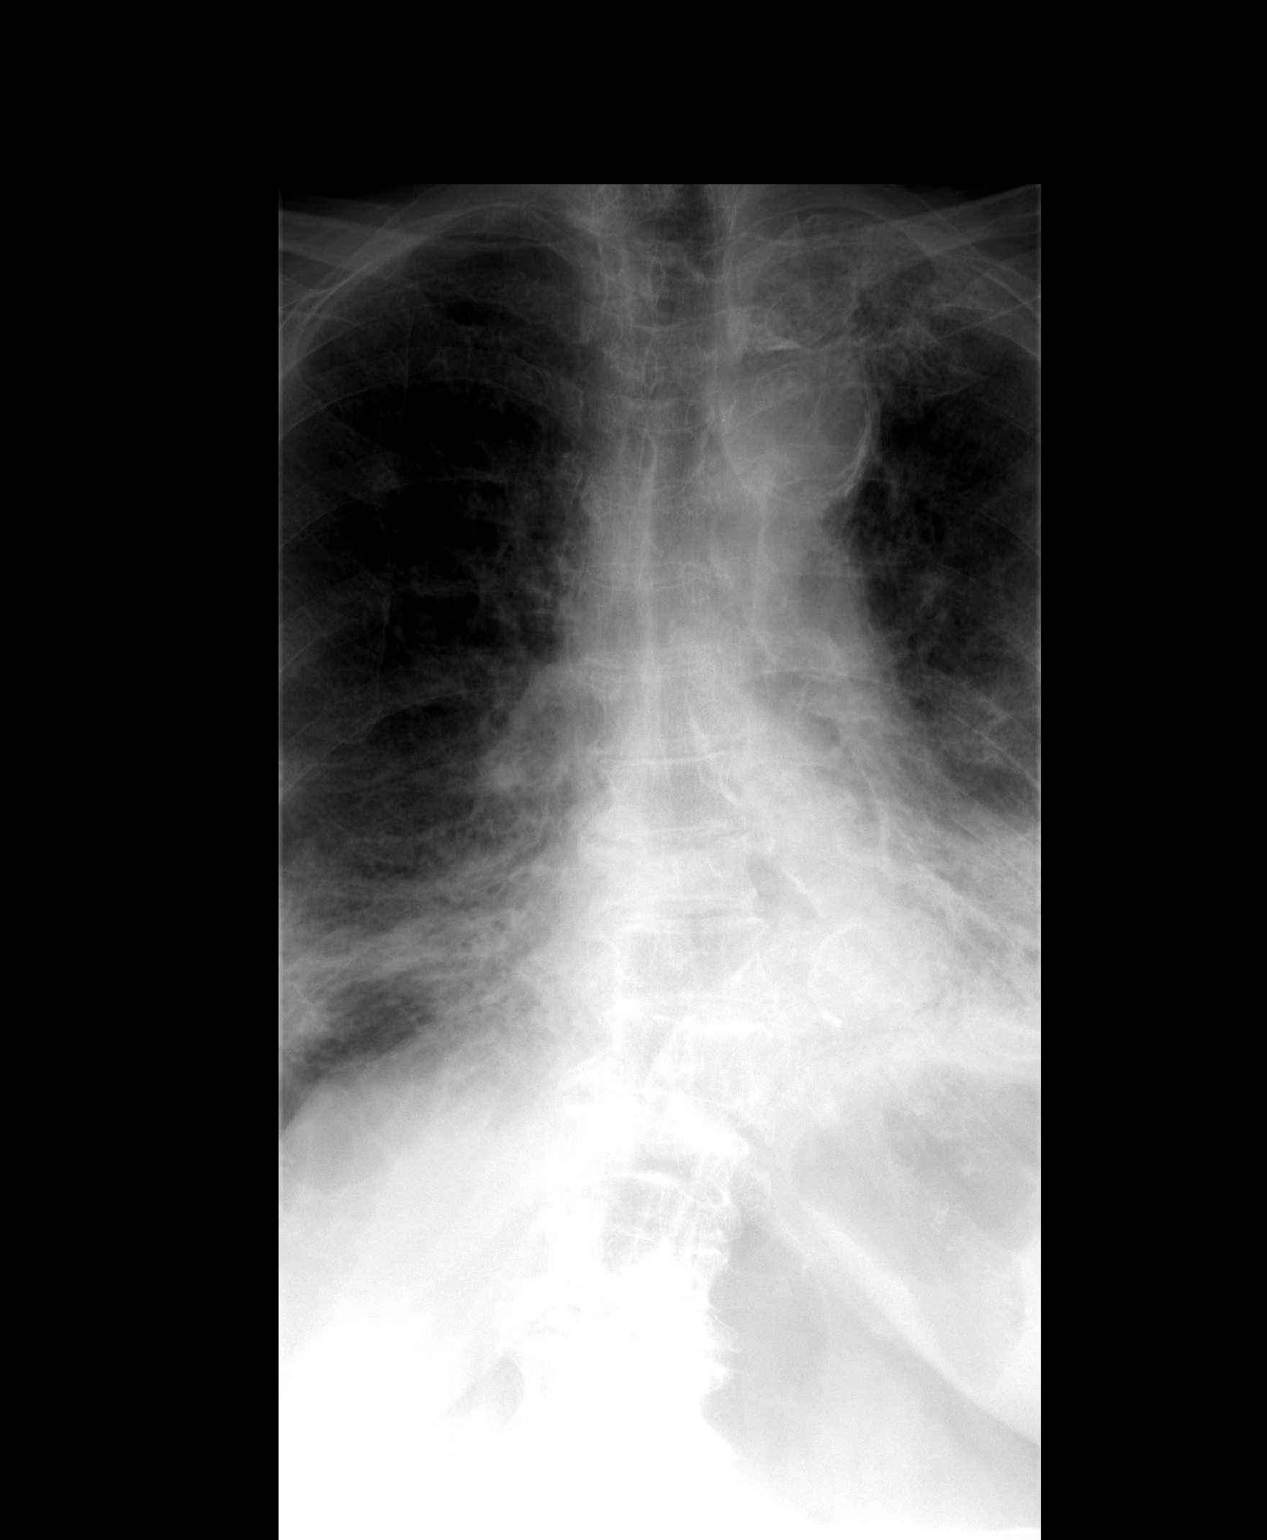

[view not recorded (2 of 3)]
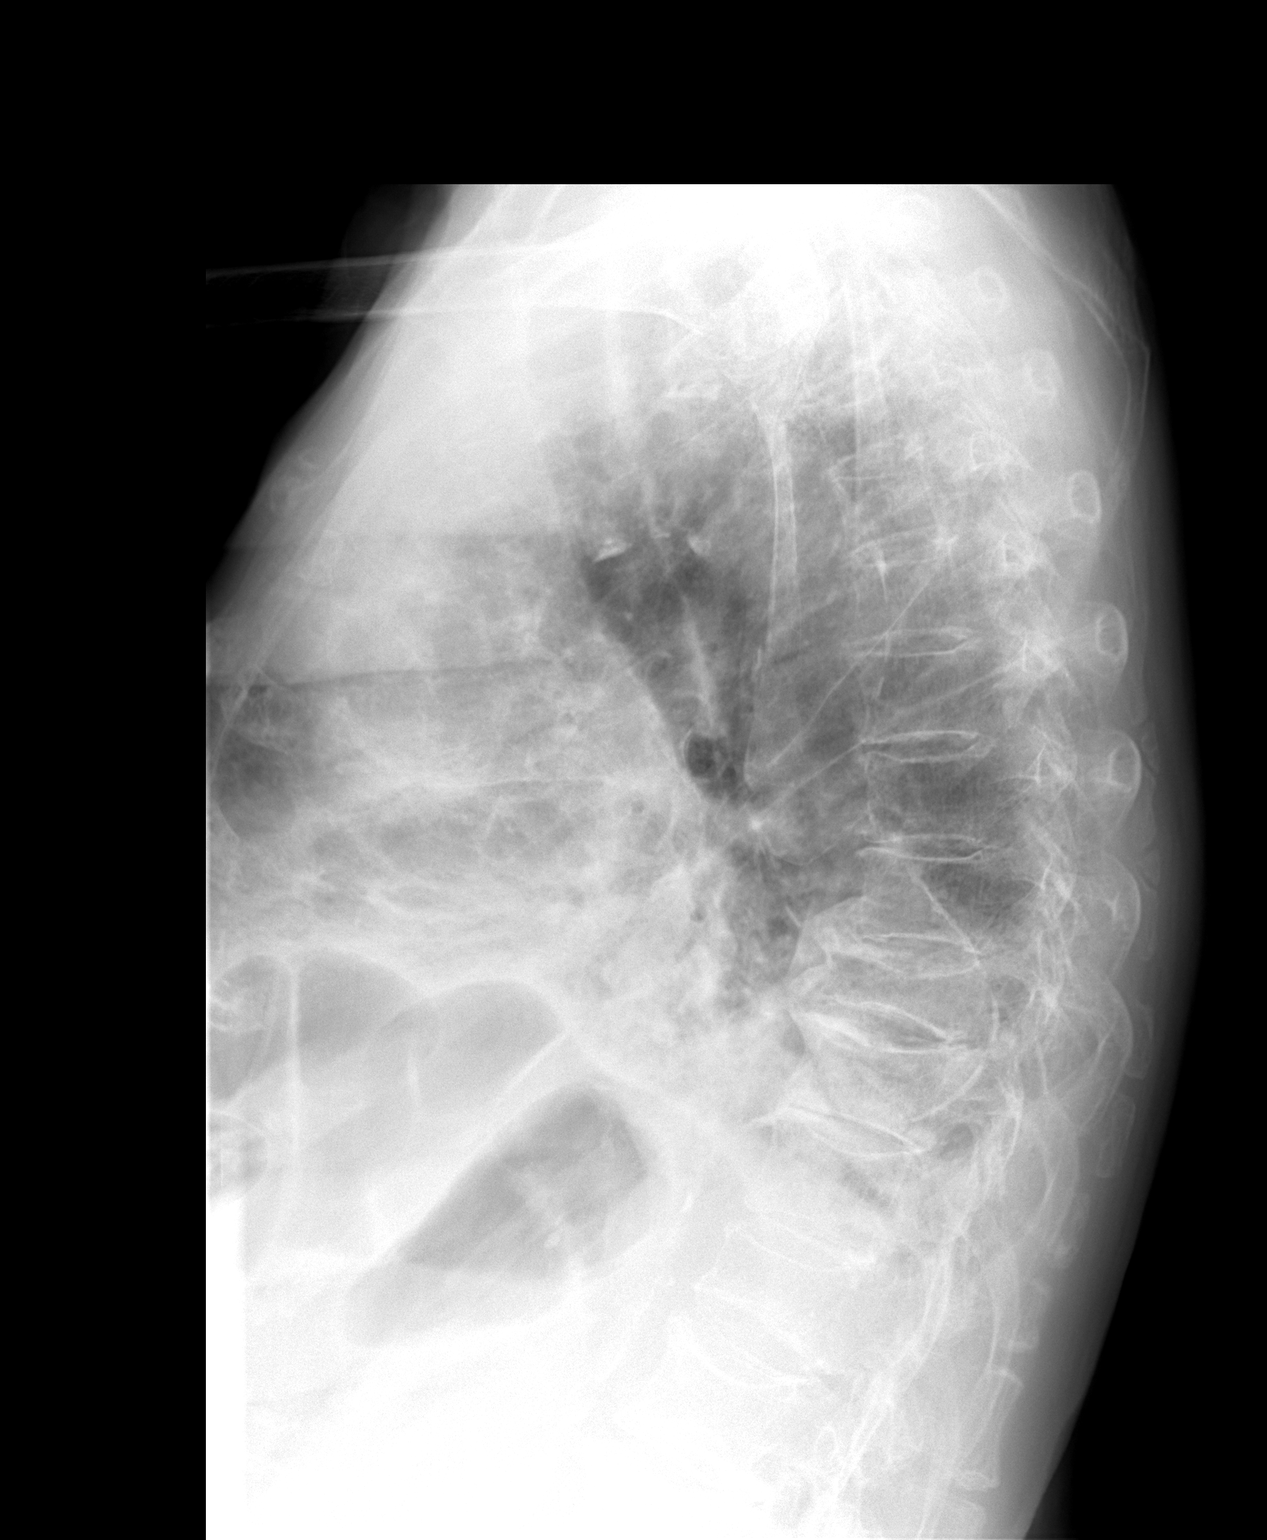

[view not recorded (3 of 3)]
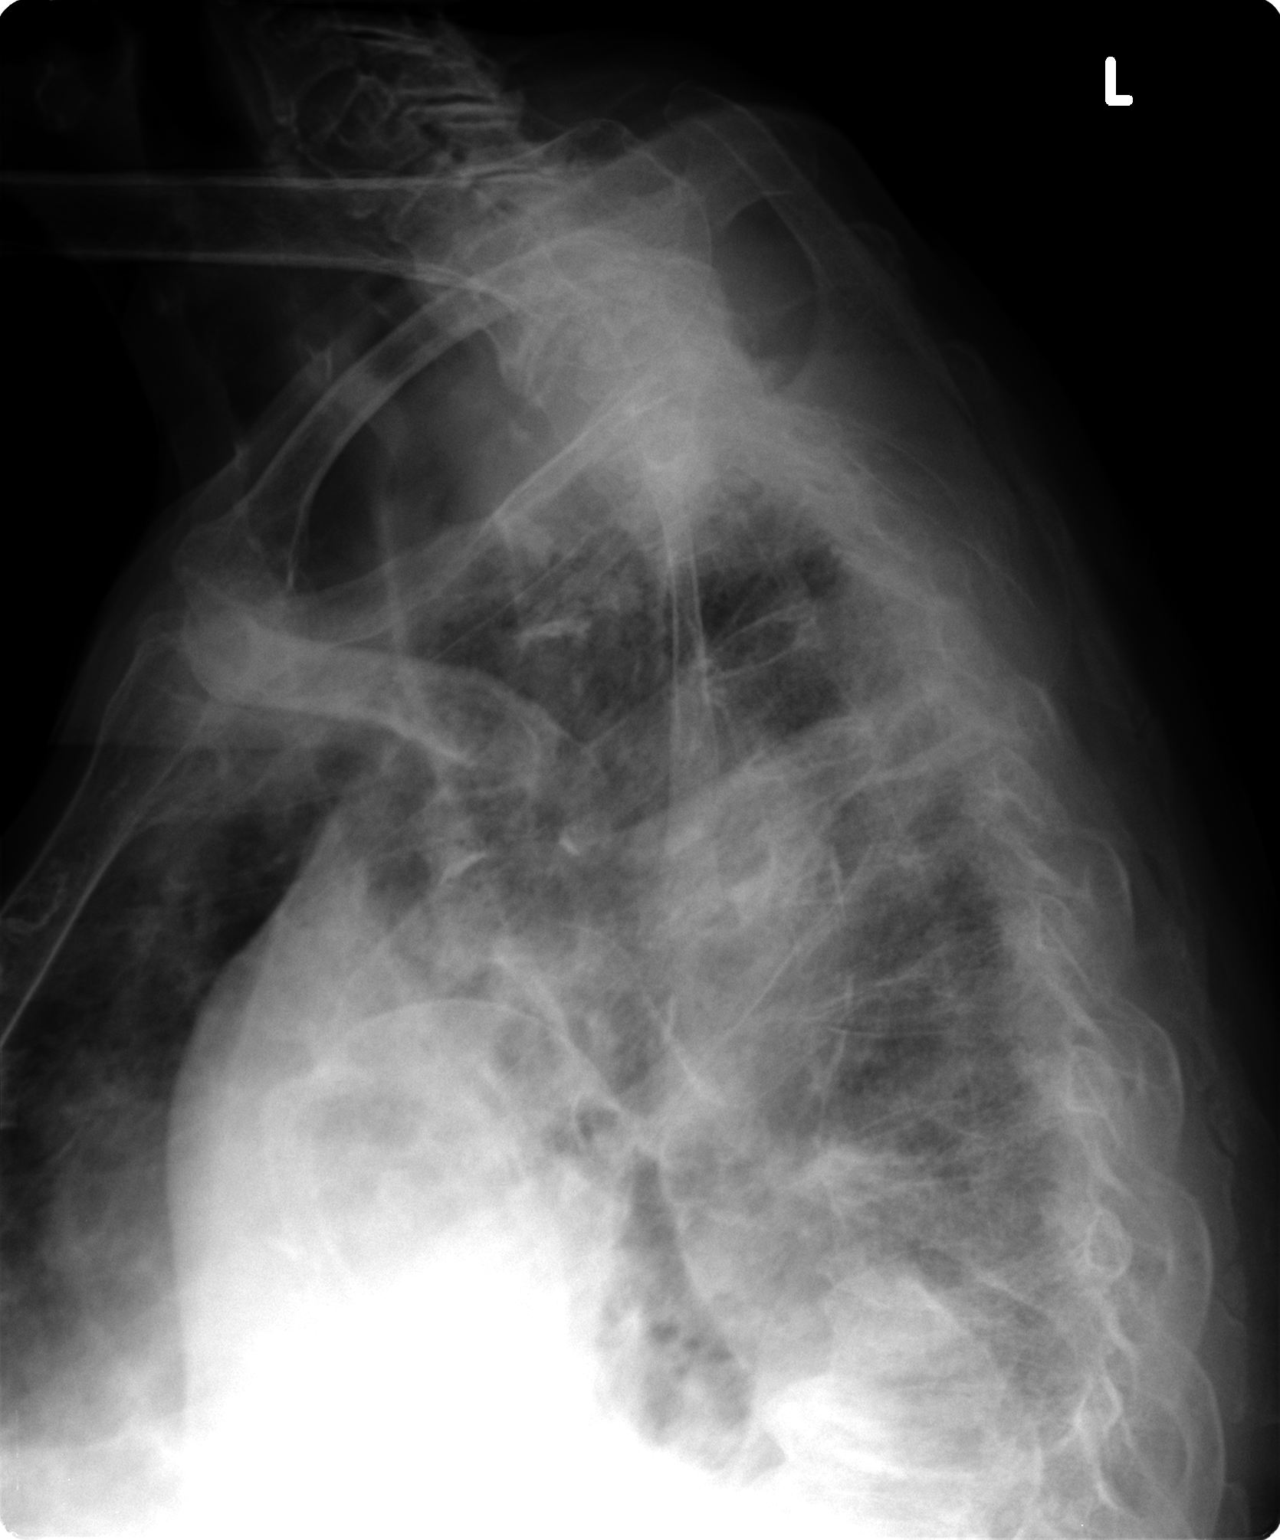

[3 of 3 positions shown; findings below may reference images not displayed]

FINDINGS: T10 compression fracture.  The vertebral body is
compressed down to approximately one half of its normal height.
This compression was also noted on the 09/20/2004 exam.  Milder
degrees of compression involving T11 and L1 vertebrae. Lower
thoracic scoliosis convex to the left.  Degenerative spondylosis.
IMPRESSION: Chronic T10 compression fracture.

## 2014-03-24 ENCOUNTER — Telehealth: Payer: Self-pay

## 2014-03-24 NOTE — Telephone Encounter (Signed)
Patient died @ [facility] [address] [place]
